# Patient Record
Sex: Female | Born: 1945 | Race: Black or African American | Hispanic: No | Marital: Single | Smoking: Light tobacco smoker
Health system: Southern US, Community
[De-identification: ages and names within clinical notes are randomized; demographics above are authoritative.]

## PROBLEM LIST (undated history)

## (undated) DIAGNOSIS — I2510 Atherosclerotic heart disease of native coronary artery without angina pectoris: Secondary | ICD-10-CM

## (undated) DIAGNOSIS — E785 Hyperlipidemia, unspecified: Secondary | ICD-10-CM

## (undated) DIAGNOSIS — N200 Calculus of kidney: Secondary | ICD-10-CM

## (undated) DIAGNOSIS — I10 Essential (primary) hypertension: Secondary | ICD-10-CM

## (undated) HISTORY — DX: Atherosclerotic heart disease of native coronary artery without angina pectoris: I25.10

## (undated) HISTORY — DX: Calculus of kidney: N20.0

## (undated) HISTORY — PX: MULTIPLE TOOTH EXTRACTIONS: SHX2053

## (undated) HISTORY — PX: CARDIAC CATHETERIZATION: SHX172

---

## 2007-09-17 ENCOUNTER — Emergency Department: Payer: Self-pay | Admitting: Emergency Medicine

## 2007-12-04 ENCOUNTER — Emergency Department: Payer: Self-pay | Admitting: Emergency Medicine

## 2007-12-10 ENCOUNTER — Emergency Department: Payer: Self-pay | Admitting: Emergency Medicine

## 2008-04-20 ENCOUNTER — Ambulatory Visit: Payer: Self-pay | Admitting: Urology

## 2008-06-03 ENCOUNTER — Emergency Department: Payer: Self-pay | Admitting: Emergency Medicine

## 2009-07-17 IMAGING — CR DG ABDOMEN 1V
1 series · 1 of 1 positions shown · non-contrast
Comparison: none

REASON FOR EXAM: nephrolithiasis
COMMENTS:

[view not recorded]
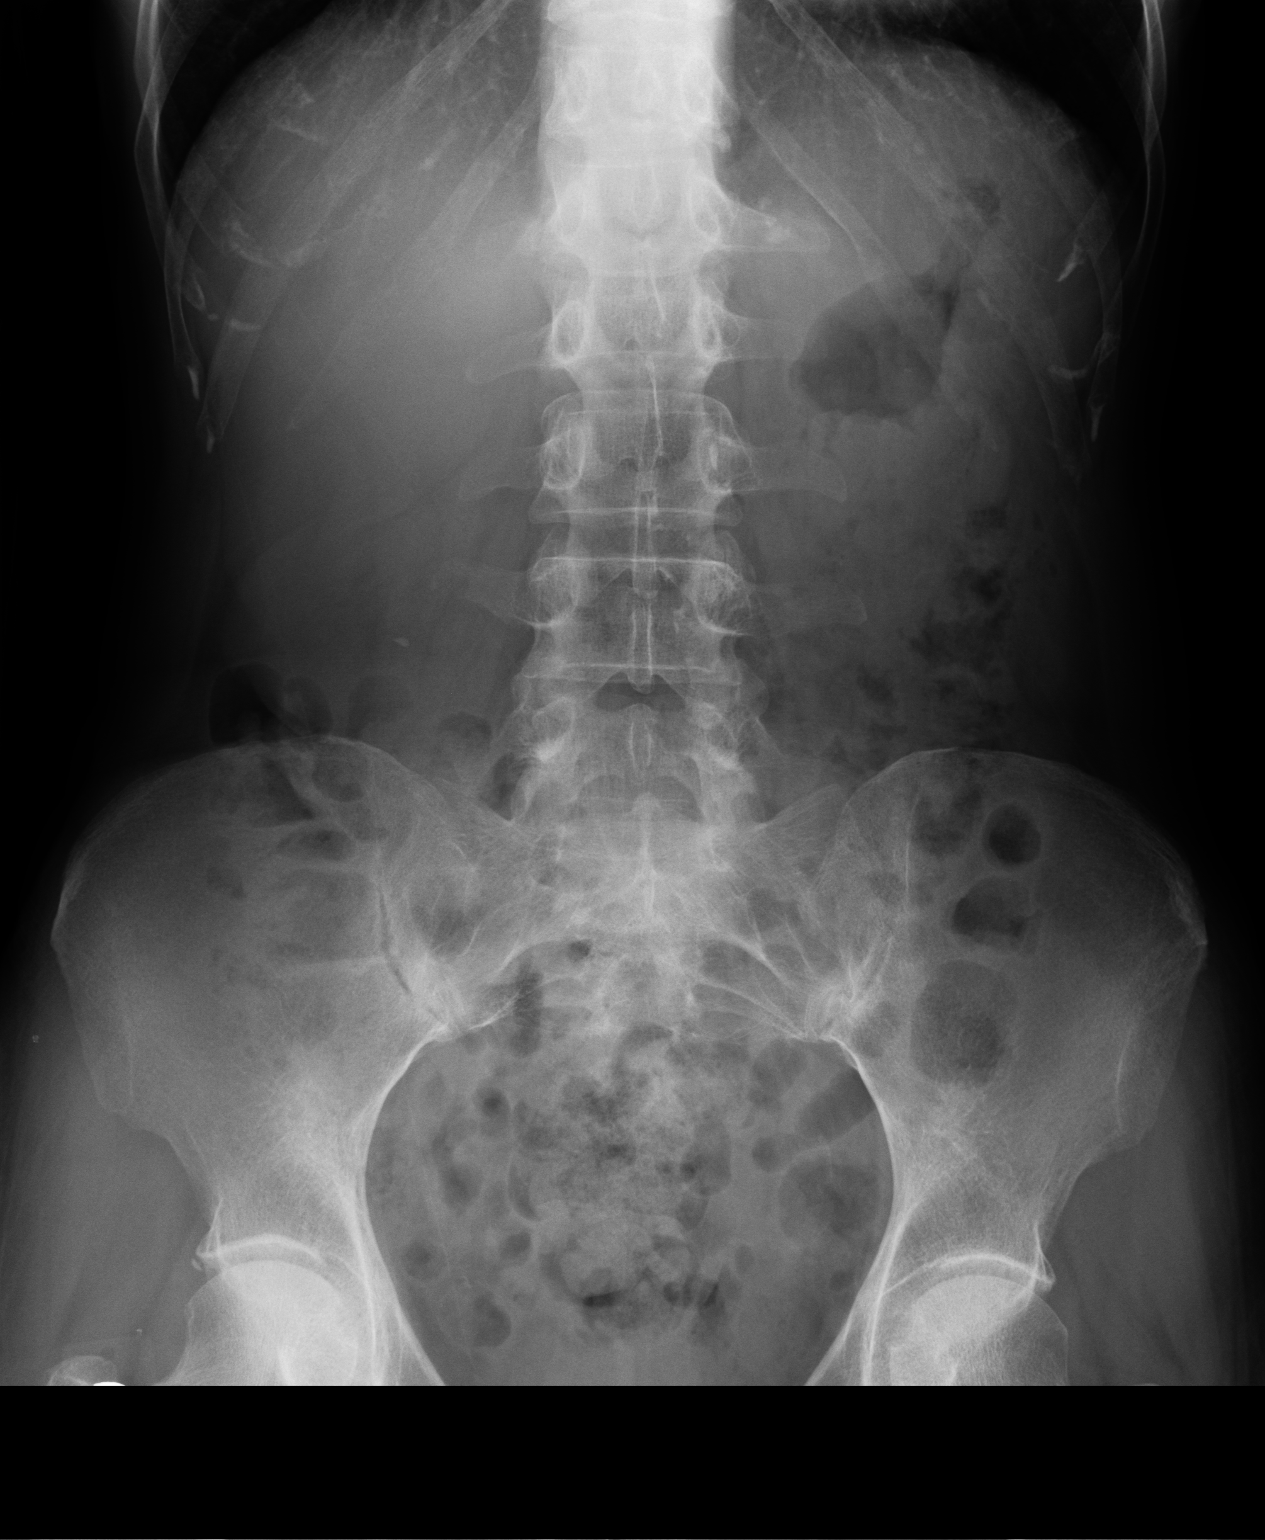

[1 of 1 positions shown; findings below may reference images not displayed]

PROCEDURE:     DXR - DXR KIDNEY URETER BLADDER  - April 20, 2008  [DATE]

RESULT:     There is a 4 mm calcification projected at the lower pole of the
RIGHT kidney. This is suspicious for a RIGHT renal stone as was previously
noted at CT. No LEFT renal stones are seen. No ureteral calcifications are
identified.
IMPRESSION: Probable RIGHT nephrolithiasis.

## 2010-04-19 ENCOUNTER — Inpatient Hospital Stay: Payer: Self-pay | Admitting: Internal Medicine

## 2015-05-14 ENCOUNTER — Encounter: Payer: Self-pay | Admitting: Intensive Care

## 2015-05-14 ENCOUNTER — Emergency Department
Admission: EM | Admit: 2015-05-14 | Discharge: 2015-05-15 | Disposition: A | Discharge status: Home / Self Care | Payer: Medicare Other | Attending: Emergency Medicine | Admitting: Emergency Medicine

## 2015-05-14 ENCOUNTER — Emergency Department: Payer: Medicare Other

## 2015-05-14 DIAGNOSIS — Z79899 Other long term (current) drug therapy: Secondary | ICD-10-CM | POA: Diagnosis not present

## 2015-05-14 DIAGNOSIS — Z7982 Long term (current) use of aspirin: Secondary | ICD-10-CM | POA: Insufficient documentation

## 2015-05-14 DIAGNOSIS — J441 Chronic obstructive pulmonary disease with (acute) exacerbation: Secondary | ICD-10-CM | POA: Diagnosis not present

## 2015-05-14 DIAGNOSIS — R079 Chest pain, unspecified: Secondary | ICD-10-CM | POA: Diagnosis present

## 2015-05-14 DIAGNOSIS — Z720 Tobacco use: Secondary | ICD-10-CM | POA: Diagnosis not present

## 2015-05-14 DIAGNOSIS — I10 Essential (primary) hypertension: Secondary | ICD-10-CM | POA: Insufficient documentation

## 2015-05-14 DIAGNOSIS — J449 Chronic obstructive pulmonary disease, unspecified: Secondary | ICD-10-CM

## 2015-05-14 DIAGNOSIS — Z880 Allergy status to penicillin: Secondary | ICD-10-CM | POA: Insufficient documentation

## 2015-05-14 HISTORY — DX: Hyperlipidemia, unspecified: E78.5

## 2015-05-14 HISTORY — DX: Essential (primary) hypertension: I10

## 2015-05-14 LAB — CBC
HCT: 41.1 % (ref 35.0–47.0)
Hemoglobin: 13.2 g/dL (ref 12.0–16.0)
MCH: 24.1 pg — ABNORMAL LOW (ref 26.0–34.0)
MCHC: 32.1 g/dL (ref 32.0–36.0)
MCV: 75.1 fL — ABNORMAL LOW (ref 80.0–100.0)
PLATELETS: 327 10*3/uL (ref 150–440)
RBC: 5.48 MIL/uL — AB (ref 3.80–5.20)
RDW: 14.4 % (ref 11.5–14.5)
WBC: 9.8 10*3/uL (ref 3.6–11.0)

## 2015-05-14 LAB — BASIC METABOLIC PANEL
Anion gap: 6 (ref 5–15)
BUN: 7 mg/dL (ref 6–20)
CALCIUM: 9.5 mg/dL (ref 8.9–10.3)
CHLORIDE: 108 mmol/L (ref 101–111)
CO2: 27 mmol/L (ref 22–32)
CREATININE: 0.78 mg/dL (ref 0.44–1.00)
GFR calc non Af Amer: >60 mL/min (ref >60)
Glucose, Bld: 95 mg/dL (ref 65–99)
Potassium: 3.9 mmol/L (ref 3.5–5.1)
SODIUM: 141 mmol/L (ref 135–145)

## 2015-05-14 LAB — TROPONIN I

## 2015-05-14 NOTE — ED Provider Notes (Addendum)
[facility] Emergency Department Provider Note  ____________________________________________  Time seen: Approximately 11:10 PM  I have reviewed the triage vital signs and the nursing notes.   HISTORY  Chief Complaint Chest Pain    HPI [place] M Boshers is a 69 y.o. female who presents to the ED from home via EMS with a chief complaint of chest pain. Patient describes sharp, left-sided chest pain which began approximately 3:30 AM while she was still awake. Describes nonradiating sharp pain lasting seconds, then going away, waxing/waning throughout the day. Symptoms are not associated with diaphoresis, shortness of breath, nausea, vomiting or palpitations. Patient just started an increase in lisinopril from 20 mg to 40 mg yesterday.States has some allergy issues with dry cough. Denies fever, chills, abdominal pain, diarrhea, dysuria, headache, calf pain or swelling. Denies recent travel, injury or hormone use.   Past Medical History  Diagnosis Date  . Hypertension   . Hyperlipidemia   . MI (myocardial infarction)     There are no active problems to display for this patient.   Past Surgical History  Procedure Laterality Date  . Coronary artery bypass graft      Current Outpatient Rx  Name  Route  Sig  Dispense  Refill  . aspirin 81 MG chewable tablet   Oral   Chew 81 mg by mouth daily.         Marland Kitchen lisinopril (PRINIVIL,ZESTRIL) 40 MG tablet   Oral   Take 40 mg by mouth daily.         . metoprolol tartrate (LOPRESSOR) 25 MG tablet   Oral   Take 25 mg by mouth 2 (two) times daily.         . simvastatin (ZOCOR) 20 MG tablet   Oral   Take 20 mg by mouth daily.         Marland Kitchen albuterol (PROVENTIL HFA;VENTOLIN HFA) 108 (90 BASE) MCG/ACT inhaler   Inhalation   Inhale 2 puffs into the lungs every 4 (four) hours as needed for wheezing or shortness of breath.   1 Inhaler   0     Allergies Penicillins  History reviewed. No pertinent family  history.  Social History Social History  Substance Use Topics  . Smoking status: Current Every Day Smoker    Types: Cigarettes  . Smokeless tobacco: Never Used  . Alcohol Use: No    Review of Systems Constitutional: No fever/chills Eyes: No visual changes. ENT: No sore throat. Cardiovascular: Positive for chest pain. Respiratory: Denies shortness of breath. Gastrointestinal: No abdominal pain.  No nausea, no vomiting.  No diarrhea.  No constipation. Genitourinary: Negative for dysuria. Musculoskeletal: Negative for back pain. Skin: Negative for rash. Neurological: Negative for headaches, focal weakness or numbness.  10-point ROS otherwise negative.  ____________________________________________   PHYSICAL EXAM:  VITAL SIGNS: ED Triage Vitals  Enc Vitals Group     BP 05/14/15 2056 203/104 mmHg     Pulse Rate 05/14/15 2057 64     Resp 05/14/15 2103 16     Temp 05/14/15 2103 98.4 F (36.9 C)     Temp Source 05/14/15 2103 Oral     SpO2 05/14/15 2057 100 %     Weight 05/14/15 2103 134 lb (60.782 kg)     Height 05/14/15 2103 5\' 5"  (1.651 m)     Head Cir --      Peak Flow --      Pain Score 05/14/15 2104 6     Pain Loc --  Pain Edu? --      Excl. in GC? --     Constitutional: Alert and oriented. Well appearing and in no acute distress. Eyes: Conjunctivae are normal. PERRL. EOMI. Head: Atraumatic. Nose: No congestion/rhinnorhea. Mouth/Throat: Mucous membranes are moist.  Oropharynx non-erythematous. Neck: No stridor.   Cardiovascular: Normal rate, regular rhythm. Grossly normal heart sounds.  Good peripheral circulation. Respiratory: Normal respiratory effort.  No retractions. Lungs CTAB. Gastrointestinal: Soft and nontender. No distention. No abdominal bruits. No CVA tenderness. Musculoskeletal: No lower extremity tenderness nor edema.  No joint effusions. Neurologic:  Normal speech and language. No gross focal neurologic deficits are appreciated. No gait  instability. Skin:  Skin is warm, dry and intact. No rash noted. Psychiatric: Mood and affect are normal. Speech and behavior are normal.  ____________________________________________   LABS (all labs ordered are listed, but only abnormal results are displayed)  Labs Reviewed  CBC - Abnormal; Notable for the following:    RBC 5.48 (*)    MCV 75.1 (*)    MCH 24.1 (*)    All other components within normal limits  FIBRIN DERIVATIVES D-DIMER ([facility] ONLY) - Abnormal; Notable for the following:    Fibrin derivatives D-dimer [facility]) 8379 (*)    All other components within normal limits  BASIC METABOLIC PANEL  TROPONIN I  TROPONIN I   ____________________________________________  EKG  ED ECG REPORT I, Aireonna Bauer J, the attending physician, personally viewed and interpreted this ECG.   Date: 05/14/2015  EKG Time: 2101  Rate: 58  Rhythm: sinus bradycardia  Axis: Normal  Intervals:none  ST&T Change: Nonspecific  ____________________________________________  RADIOLOGY  Chest 2 view (viewed by me, interpreted per Dr. Chilton Si): No active cardiopulmonary disease.  CT chest interpreted per Dr. Andria Meuse: No evidence of significant pulmonary embolus. Emphysematous changes in the lungs. No evidence of active consolidation. ____________________________________________   PROCEDURES  Procedure(s) performed: None  Critical Care performed: No  ____________________________________________   INITIAL IMPRESSION / ASSESSMENT AND PLAN / ED COURSE  Pertinent labs & imaging results that were available during my care of the patient were reviewed by me and considered in my medical decision making (see chart for details).  69 year old female who presents with atypical chest pain onset at 3:30am yesterday morning. Initial EKG and troponin negative. Plan to repeat 3 hour troponin as well as check a d-dimer. Currently patient resting in no acute distress without complaints of  pain.  ----------------------------------------- 12:19 AM on 05/15/2015 -----------------------------------------  Elevated d-dimer noted. Will obtain CT angiography chest to evaluate for pulmonary embolus.  ----------------------------------------- 3:00 AM on 05/15/2015 -----------------------------------------  Delay secondary to critical patient. Updated patient and family of negative repeat troponin and CT results. Will prescribe albuterol inhaler for patient with occasional wheezing secondary to smoking. Will follow up with cardiologist next week. Strict return precautions given. Patient and family verbalizes understanding and agree with plan of care. ____________________________________________   FINAL CLINICAL IMPRESSION(S) / ED DIAGNOSES  Final diagnoses:  Chest pain, unspecified chest pain type  Chronic obstructive pulmonary disease, unspecified COPD, unspecified chronic bronchitis type      Irean Hong, MD 05/15/15 8295  Irean Hong, MD 05/15/15 6213  Irean Hong, MD 05/15/15 0865

## 2015-05-14 NOTE — ED Notes (Signed)
Patient c/o chest pain that started last night. She reports " I will feel a shooting pain and it will go away for 30 minutes and then I will feel the sharp shooting pain again". Reports it is L sided chest pain with movement. HX MI in 2009-stents placed. Blood pressure with EMS 221/97

## 2015-05-15 ENCOUNTER — Emergency Department: Payer: Medicare Other

## 2015-05-15 DIAGNOSIS — R079 Chest pain, unspecified: Secondary | ICD-10-CM | POA: Diagnosis not present

## 2015-05-15 LAB — FIBRIN DERIVATIVES D-DIMER (ARMC ONLY): Fibrin derivatives D-dimer (ARMC): 8379 — ABNORMAL HIGH (ref 0–499)

## 2015-05-15 LAB — TROPONIN I: Troponin I: <0.03 ng/mL (ref <0.031)

## 2015-05-15 MED ORDER — IOHEXOL 350 MG/ML SOLN
75.0000 mL | Freq: Once | INTRAVENOUS | Status: AC | PRN
  Administered 2015-05-15: 75 mL via INTRAVENOUS

## 2015-05-15 MED ORDER — ALBUTEROL SULFATE HFA 108 (90 BASE) MCG/ACT IN AERS: 2.0000 | INHALATION_SPRAY | RESPIRATORY_TRACT | Status: DC | PRN

## 2015-05-15 MED ORDER — SODIUM CHLORIDE 0.9 % IV BOLUS (SEPSIS)
500.0000 mL | Freq: Once | INTRAVENOUS | Status: AC
  Administered 2015-05-15: 500 mL via INTRAVENOUS

## 2015-05-15 NOTE — Discharge Instructions (Signed)
1. Use albuterol inhaler 2 puffs every 4 hours as needed for wheezing. 2. Return to the ER for worsening symptoms, persistent vomiting, difficulty breathing or other concerns.  Chest Pain (Nonspecific) It is often hard to give a specific diagnosis for the cause of chest pain. There is always a chance that your pain could be related to something serious, such as a heart attack or a blood clot in the lungs. You need to follow up with your health care provider for further evaluation. CAUSES   Heartburn.  Pneumonia or bronchitis.  Anxiety or stress.  Inflammation around your heart (pericarditis) or lung (pleuritis or pleurisy).  A blood clot in the lung.  A collapsed lung (pneumothorax). It can develop suddenly on its own (spontaneous pneumothorax) or from trauma to the chest.  Shingles infection (herpes zoster virus). The chest wall is composed of bones, muscles, and cartilage. Any of these can be the source of the pain.  The bones can be bruised by injury.  The muscles or cartilage can be strained by coughing or overwork.  The cartilage can be affected by inflammation and become sore (costochondritis). DIAGNOSIS  Lab tests or other studies may be needed to find the cause of your pain. Your health care provider may have you take a test called an ambulatory electrocardiogram (ECG). An ECG records your heartbeat patterns over a 24-hour period. You may also have other tests, such as:  Transthoracic echocardiogram (TTE). During echocardiography, sound waves are used to evaluate how blood flows through your heart.  Transesophageal echocardiogram (TEE).  Cardiac monitoring. This allows your health care provider to monitor your heart rate and rhythm in real time.  Holter monitor. This is a portable device that records your heartbeat and can help diagnose heart arrhythmias. It allows your health care provider to track your heart activity for several days, if needed.  Stress tests by  exercise or by giving medicine that makes the heart beat faster. TREATMENT   Treatment depends on what may be causing your chest pain. Treatment may include:  Acid blockers for heartburn.  Anti-inflammatory medicine.  Pain medicine for inflammatory conditions.  Antibiotics if an infection is present.  You may be advised to change lifestyle habits. This includes stopping smoking and avoiding alcohol, caffeine, and chocolate.  You may be advised to keep your head raised (elevated) when sleeping. This reduces the chance of acid going backward from your stomach into your esophagus. Most of the time, nonspecific chest pain will improve within 2-3 days with rest and mild pain medicine.  HOME CARE INSTRUCTIONS   If antibiotics were prescribed, take them as directed. Finish them even if you start to feel better.  For the next few days, avoid physical activities that bring on chest pain. Continue physical activities as directed.  Do not use any tobacco products, including cigarettes, chewing tobacco, or electronic cigarettes.  Avoid drinking alcohol.  Only take medicine as directed by your health care provider.  Follow your health care provider's suggestions for further testing if your chest pain does not go away.  Keep any follow-up appointments you made. If you do not go to an appointment, you could develop lasting (chronic) problems with pain. If there is any problem keeping an appointment, call to reschedule. SEEK MEDICAL CARE IF:  1. Your chest pain does not go away, even after treatment. 2. You have a rash with blisters on your chest. 3. You have a fever. SEEK IMMEDIATE MEDICAL CARE IF:   You have increased  chest pain or pain that spreads to your arm, neck, jaw, back, or abdomen.  You have shortness of breath.  You have an increasing cough, or you cough up blood.  You have severe back or abdominal pain.  You feel nauseous or vomit.  You have severe weakness.  You  faint.  You have chills. This is an emergency. Do not wait to see if the pain will go away. Get medical help at once. Call your local emergency services (911 in [place] Do not drive yourself to the hospital. MAKE SURE YOU:   Understand these instructions.  Will watch your condition.  Will get help right away if you are not doing well or get worse. Document Released: 05/31/2005 Document Revised: 08/26/2013 Document Reviewed: 03/26/2008 [facility] Patient Information 2015 [place], [place]. This information is not intended to replace advice given to you by your health care provider. Make sure you discuss any questions you have with your health care provider.  Chronic Obstructive Pulmonary Disease Chronic obstructive pulmonary disease (COPD) is a common lung condition in which airflow from the lungs is limited. COPD is a general term that can be used to describe many different lung problems that limit airflow, including both chronic bronchitis and emphysema. If you have COPD, your lung function will probably never return to normal, but there are measures you can take to improve lung function and make yourself feel better.  CAUSES   Smoking (common).   Exposure to secondhand smoke.   Genetic problems.  Chronic inflammatory lung diseases or recurrent infections. SYMPTOMS   Shortness of breath, especially with physical activity.   Deep, persistent (chronic) cough with a large amount of thick mucus.   Wheezing.   Rapid breaths (tachypnea).   Gray or bluish discoloration (cyanosis) of the skin, especially in fingers, toes, or lips.   Fatigue.   Weight loss.   Frequent infections or episodes when breathing symptoms become much worse (exacerbations).   Chest tightness. DIAGNOSIS  Your health care provider will take a medical history and perform a physical examination to make the initial diagnosis. Additional tests for COPD may include:   Lung (pulmonary) function  tests.  Chest X-ray.  CT scan.  Blood tests. TREATMENT  Treatment available to help you feel better when you have COPD includes:   Inhaler and nebulizer medicines. These help manage the symptoms of COPD and make your breathing more comfortable.  Supplemental oxygen. Supplemental oxygen is only helpful if you have a low oxygen level in your blood.   Exercise and physical activity. These are beneficial for nearly all people with COPD. Some people may also benefit from a pulmonary rehabilitation program. HOME CARE INSTRUCTIONS   Take all medicines (inhaled or pills) as directed by your health care provider.  Avoid over-the-counter medicines or cough syrups that dry up your airway (such as antihistamines) and slow down the elimination of secretions unless instructed otherwise by your health care provider.   If you are a smoker, the most important thing that you can do is stop smoking. Continuing to smoke will cause further lung damage and breathing trouble. Ask your health care provider for help with quitting smoking. He or she can direct you to community resources or hospitals that provide support.  Avoid exposure to irritants such as smoke, chemicals, and fumes that aggravate your breathing.  Use oxygen therapy and pulmonary rehabilitation if directed by your health care provider. If you require home oxygen therapy, ask your health care provider whether you should purchase  a pulse oximeter to measure your oxygen level at home.   Avoid contact with individuals who have a contagious illness.  Avoid extreme temperature and humidity changes.  Eat healthy foods. Eating smaller, more frequent meals and resting before meals may help you maintain your strength.  Stay active, but balance activity with periods of rest. Exercise and physical activity will help you maintain your ability to do things you want to do.  Preventing infection and hospitalization is very important when you have  COPD. Make sure to receive all the vaccines your health care provider recommends, especially the pneumococcal and influenza vaccines. Ask your health care provider whether you need a pneumonia vaccine.  Learn and use relaxation techniques to manage stress.  Learn and use controlled breathing techniques as directed by your health care provider. Controlled breathing techniques include:   Pursed lip breathing. Start by breathing in (inhaling) through your nose for 1 second. Then, purse your lips as if you were going to whistle and breathe out (exhale) through the pursed lips for 2 seconds.   Diaphragmatic breathing. Start by putting one hand on your abdomen just above your waist. Inhale slowly through your nose. The hand on your abdomen should move out. Then purse your lips and exhale slowly. You should be able to feel the hand on your abdomen moving in as you exhale.   Learn and use controlled coughing to clear mucus from your lungs. Controlled coughing is a series of short, progressive coughs. The steps of controlled coughing are:  4. Lean your head slightly forward.  5. Breathe in deeply using diaphragmatic breathing.  6. Try to hold your breath for 3 seconds.  7. Keep your mouth slightly open while coughing twice.  8. Spit any mucus out into a tissue.  9. Rest and repeat the steps once or twice as needed. SEEK MEDICAL CARE IF:   You are coughing up more mucus than usual.   There is a change in the color or thickness of your mucus.   Your breathing is more labored than usual.   Your breathing is faster than usual.  SEEK IMMEDIATE MEDICAL CARE IF:   You have shortness of breath while you are resting.   You have shortness of breath that prevents you from:  Being able to talk.   Performing your usual physical activities.   You have chest pain lasting longer than 5 minutes.   Your skin color is more cyanotic than usual.  You measure low oxygen saturations for  longer than 5 minutes with a pulse oximeter. MAKE SURE YOU:   Understand these instructions.  Will watch your condition.  Will get help right away if you are not doing well or get worse. Document Released: 05/31/2005 Document Revised: 01/05/2014 Document Reviewed: 04/17/2013 [facility] Patient Information 2015 [place], [place]. This information is not intended to replace advice given to you by your health care provider. Make sure you discuss any questions you have with your health care provider.

## 2015-05-15 NOTE — ED Notes (Signed)
Patient transported to CT 

## 2015-06-28 ENCOUNTER — Ambulatory Visit (INDEPENDENT_AMBULATORY_CARE_PROVIDER_SITE_OTHER): Payer: Medicare Other | Admitting: Cardiovascular Disease

## 2015-06-28 ENCOUNTER — Encounter: Payer: Self-pay | Admitting: Cardiovascular Disease

## 2015-06-28 VITALS — BP 170/88 | HR 54 | Ht 65.0 in | Wt 132.8 lb

## 2015-06-28 DIAGNOSIS — Z720 Tobacco use: Secondary | ICD-10-CM | POA: Diagnosis not present

## 2015-06-28 DIAGNOSIS — I10 Essential (primary) hypertension: Secondary | ICD-10-CM

## 2015-06-28 DIAGNOSIS — R0789 Other chest pain: Secondary | ICD-10-CM

## 2015-06-28 MED ORDER — CHLORTHALIDONE 25 MG PO TABS: 25.0000 mg | ORAL_TABLET | Freq: Every day | ORAL | Status: DC

## 2015-06-28 NOTE — Patient Instructions (Signed)
Medication Instructions:  Your physician has recommended you make the following change in your medication:  START taking chlorthalidone  once per day    Labwork: BMET in one week  Testing/Procedures: Your physician has requested that you have a renal artery duplex in one week. During this test, an ultrasound is used to evaluate blood flow to the kidneys. Allow one hour for this exam. Do not eat after midnight the day before and avoid carbonated beverages. Take your medications as you usually do.  Your physician has requested that you have an exercise tolerance test in 2 weeks. For further information please visit [email or web address] Please also follow instruction sheet, as given. DO NOT TAKE METOPROLOL THE MORNING OF YOUR TEST.     Follow-Up: Your physician recommends that you schedule a follow-up appointment in: one month with Dr. Kirke Corin.    Any Other Special Instructions Will Be Listed Below (If Applicable).  Exercise Stress Electrocardiogram An exercise stress electrocardiogram is a test that is done to evaluate the blood supply to your heart. This test may also be called exercise stress electrocardiography. The test is done while you are walking on a treadmill. The goal of this test is to raise your heart rate. This test is done to find areas of poor blood flow to the heart by determining the extent of coronary artery disease (CAD).   CAD is defined as narrowing in one or more heart (coronary) arteries of more than 70%. If you have an abnormal test result, this may mean that you are not getting adequate blood flow to your heart during exercise. Additional testing may be needed to understand why your test was abnormal. LET [facility] CARE PROVIDER KNOW ABOUT:   Any allergies you have.  All medicines you are taking, including vitamins, herbs, eye drops, creams, and over-the-counter medicines.  Previous problems you or members of your family have had with the use of  anesthetics.  Any blood disorders you have.  Previous surgeries you have had.  Medical conditions you have.  Possibility of pregnancy, if this applies. RISKS AND COMPLICATIONS Generally, this is a safe procedure. However, as with any procedure, complications can occur. Possible complications can include:  Pain or pressure in the following areas:  Chest.  Jaw or neck.  Between your shoulder blades.  Radiating down your left arm.  Dizziness or light-headedness.  Shortness of breath.  Increased or irregular heartbeats.  Nausea or vomiting.  Heart attack (rare). BEFORE THE PROCEDURE  Avoid all forms of caffeine 24 hours before your test or as directed by your health care provider. This includes coffee, tea (even decaffeinated tea), caffeinated sodas, chocolate, cocoa, and certain pain medicines.  Follow your health care provider's instructions regarding eating and drinking before the test.  Take your medicines as directed at regular times with water unless instructed otherwise. Exceptions may include:  If you have diabetes, ask how you are to take your insulin or pills. It is common to adjust insulin dosing the morning of the test.  If you are taking beta-blocker medicines, it is important to talk to your health care provider about these medicines well before the date of your test. Taking beta-blocker medicines may interfere with the test. In some cases, these medicines need to be changed or stopped 24 hours or more before the test.  If you wear a nitroglycerin patch, it may need to be removed prior to the test. Ask your health care provider if the patch should be removed before the test.  If you use an inhaler for any breathing condition, bring it with you to the test.  If you are an outpatient, bring a snack so you can eat right after the stress phase of the test.  Do not smoke for 4 hours prior to the test or as directed by your health care provider.  Do not apply  lotions, powders, creams, or oils on your chest prior to the test.  Wear loose-fitting clothes and comfortable shoes for the test. This test involves walking on a treadmill. PROCEDURE  Multiple patches (electrodes) will be put on your chest. If needed, small areas of your chest may have to be shaved to get better contact with the electrodes. Once the electrodes are attached to your body, multiple wires will be attached to the electrodes and your heart rate will be monitored.  Your heart will be monitored both at rest and while exercising.  You will walk on a treadmill. The treadmill will be started at a slow pace. The treadmill speed and incline will gradually be increased to raise your heart rate. AFTER THE PROCEDURE  Your heart rate and blood pressure will be monitored after the test.  You may return to your normal schedule including diet, activities, and medicines, unless your health care provider tells you otherwise.   This information is not intended to replace advice given to you by your health care provider. Make sure you discuss any questions you have with your health care provider.   Document Released: 08/18/2000 Document Revised: 08/26/2013 Document Reviewed: 04/28/2013 [employer] Interactive Patient Education [employer]2016 [employer]

## 2015-06-28 NOTE — Progress Notes (Signed)
Primary care physician: Dr. Letta PateAycock at [facility]  HPI  This is a 69 year old female who was referred for evaluation of chest pain and hypertension. She reports having cardiac catheterization done in 2009 without revascularization. The report is not available. She has known history of hypertension for many years which has been difficult to control. The blood pressure became more difficult to control over the last 6 months in spite of treatment with metoprolol and this in. She also has known history of hyperlipidemia on treatment and prolonged history of tobacco use and possible COPD. She smokes one pack per day and has been doing so since she was 69 years old. She had one episode of substernal chest pain described as an aching sensation in September which was induced by stress. [facility]. Troponin was negative 2. EKG showed no acute changes. D-dimer was significantly elevated but CTA of the chest showed no evidence of pulmonary embolism. She reports no further episodes of chest pain since then but blood pressure continues to be uncontrolled. She has no family history of coronary artery disease but  there is family history of hypertension. Allergies  Allergen Reactions  . Penicillins Rash     Current Outpatient Prescriptions on File Prior to Visit  Medication Sig Dispense Refill  . albuterol (PROVENTIL HFA;VENTOLIN HFA) 108 (90 BASE) MCG/ACT inhaler Inhale 2 puffs into the lungs every 4 (four) hours as needed for wheezing or shortness of breath. 1 Inhaler 0  . aspirin 81 MG chewable tablet Chew 81 mg by mouth daily.    Marland Kitchen. lisinopril (PRINIVIL,ZESTRIL) 40 MG tablet Take 40 mg by mouth daily.    . metoprolol tartrate (LOPRESSOR) 25 MG tablet Take 25 mg by mouth 2 (two) times daily.    . simvastatin (ZOCOR) 20 MG tablet Take 20 mg by mouth daily.     No current facility-administered medications on file prior to visit.     Past Medical History   Diagnosis Date  . Hypertension   . Hyperlipidemia   . MI (myocardial infarction) (HCC)   . Coronary artery disease   . Kidney stones      Past Surgical History  Procedure Laterality Date  . [facility]     Family History  Problem Relation Age of Onset  . Family history : HTN     Social History   Social History  . Marital Status: Single    Spouse Name: N/A  . Number of Children: N/A  . Years of Education: N/A   Occupational History  . Not on file.   Social History Main Topics  . Smoking status: Current Every Day Smoker -- 50 years    Types: Cigarettes  . Smokeless tobacco: Never Used  . Alcohol Use: No  . Drug Use: No  . Sexual Activity: Not on file   Other Topics Concern  . Not on file   Social History Narrative     ROS A 10 point review of system was performed. It is negative other than that mentioned in the history of present illness.   PHYSICAL EXAM   BP 170/88 mmHg  Pulse 54  Ht 5\' 5"  (1.651 m)  Wt 132 lb 12 oz (60.215 kg)  BMI 22.09 kg/m2 Constitutional: She is oriented to person, place, and time. She appears well-developed and well-nourished. No distress.  HENT: No nasal discharge.  Head: Normocephalic and atraumatic.  Eyes: Pupils are equal  and round. No discharge.  Neck: Normal range of motion. Neck supple. No JVD present. No thyromegaly present.  Cardiovascular: Normal rate, regular rhythm, normal heart sounds. Exam reveals no gallop and no friction rub. No murmur heard.  Pulmonary/Chest: Effort normal and breath sounds normal. No stridor. No respiratory distress. She has no wheezes. She has no rales. She exhibits no tenderness.  Abdominal: Soft. Bowel sounds are normal. She exhibits no distension. There is no tenderness. There is no rebound and no guarding.  Musculoskeletal: Normal range of motion. She exhibits no edema and no tenderness.  Neurological: She is alert and oriented to person, place, and time.  Coordination normal.  Skin: Skin is warm and dry. No rash noted. She is not diaphoretic. No erythema. No pallor.  Psychiatric: She has a normal mood and affect. Her behavior is normal. Judgment and thought content normal.     EKG: sinus bradycardia with no significant ST or T wave changes.   ASSESSMENT AND PLAN

## 2015-06-28 NOTE — Assessment & Plan Note (Signed)
The patient's blood pressure continues to be significantly elevated which might be contributing to intermittent chest pain. She is already on lisinopril and metoprolol. I think it is important to exclude secondary hypertension especially renal artery stenosis given her prolonged history of tobacco use. Thus, I requested a renal artery duplex. I added chlorthalidone 25 mg once daily. Check basic metabolic profile in one week.

## 2015-06-28 NOTE — Assessment & Plan Note (Signed)
The chest pain is overall atypical. Cardiac exam is unremarkable and baseline ECG is normal. Considering her risk factors for coronary artery disease, I requested a treadmill stress test for evaluation.

## 2015-06-28 NOTE — Assessment & Plan Note (Signed)
I discussed with her the importance of smoking cessation. 

## 2015-07-06 ENCOUNTER — Encounter (INDEPENDENT_AMBULATORY_CARE_PROVIDER_SITE_OTHER): Payer: Self-pay

## 2015-07-06 ENCOUNTER — Ambulatory Visit (INDEPENDENT_AMBULATORY_CARE_PROVIDER_SITE_OTHER): Payer: Medicare Other

## 2015-07-06 DIAGNOSIS — I10 Essential (primary) hypertension: Secondary | ICD-10-CM | POA: Diagnosis not present

## 2015-07-07 LAB — EXERCISE TOLERANCE TEST
CSEPED: 2 min
CSEPEDS: 34 s
CSEPEW: 4.6 METS
CSEPPHR: 164 {beats}/min
MPHR: 153 {beats}/min
Percent HR: 107 %
Rest HR: 89 {beats}/min

## 2015-07-12 ENCOUNTER — Other Ambulatory Visit (INDEPENDENT_AMBULATORY_CARE_PROVIDER_SITE_OTHER): Payer: Medicare Other | Admitting: *Deleted

## 2015-07-12 DIAGNOSIS — I10 Essential (primary) hypertension: Secondary | ICD-10-CM | POA: Diagnosis not present

## 2015-07-13 LAB — BASIC METABOLIC PANEL
BUN / CREAT RATIO: 21 (ref 11–26)
BUN: 20 mg/dL (ref 8–27)
CHLORIDE: 99 mmol/L (ref 97–106)
CO2: 23 mmol/L (ref 18–29)
Calcium: 10.2 mg/dL (ref 8.7–10.3)
Creatinine, Ser: 0.94 mg/dL (ref 0.57–1.00)
GFR calc non Af Amer: 63 mL/min/{1.73_m2} (ref >59)
GFR, EST AFRICAN AMERICAN: 73 mL/min/{1.73_m2} (ref >59)
Glucose: 83 mg/dL (ref 65–99)
POTASSIUM: 4.8 mmol/L (ref 3.5–5.2)
Sodium: 140 mmol/L (ref 136–144)

## 2015-07-26 ENCOUNTER — Other Ambulatory Visit: Payer: Medicare Other

## 2015-07-26 ENCOUNTER — Ambulatory Visit (INDEPENDENT_AMBULATORY_CARE_PROVIDER_SITE_OTHER): Payer: Medicare Other

## 2015-07-26 DIAGNOSIS — I10 Essential (primary) hypertension: Secondary | ICD-10-CM

## 2015-07-27 ENCOUNTER — Telehealth: Payer: Self-pay | Admitting: Cardiovascular Disease

## 2015-07-27 NOTE — Telephone Encounter (Signed)
[facility]  Please call back.

## 2015-07-27 NOTE — Telephone Encounter (Signed)
Pt called back regarding renal US results. (see result note) Pt verbalized understanding and confirmed 11/29 appt w/Ryan Dunn Pt had no further questions.

## 2015-08-03 ENCOUNTER — Ambulatory Visit: Payer: Medicare Other | Admitting: Physician Assistant

## 2015-08-27 ENCOUNTER — Ambulatory Visit: Payer: Medicare Other | Admitting: Cardiovascular Disease

## 2015-09-13 ENCOUNTER — Other Ambulatory Visit: Payer: Self-pay

## 2015-09-13 ENCOUNTER — Ambulatory Visit (INDEPENDENT_AMBULATORY_CARE_PROVIDER_SITE_OTHER): Payer: Medicare Other | Admitting: Cardiovascular Disease

## 2015-09-13 ENCOUNTER — Encounter: Payer: Self-pay | Admitting: Cardiovascular Disease

## 2015-09-13 VITALS — BP 128/70 | HR 70 | Ht 65.0 in | Wt 127.2 lb

## 2015-09-13 DIAGNOSIS — I10 Essential (primary) hypertension: Secondary | ICD-10-CM

## 2015-09-13 DIAGNOSIS — R0789 Other chest pain: Secondary | ICD-10-CM | POA: Diagnosis not present

## 2015-09-13 DIAGNOSIS — Z720 Tobacco use: Secondary | ICD-10-CM

## 2015-09-13 MED ORDER — CHLORTHALIDONE 25 MG PO TABS: 25.0000 mg | ORAL_TABLET | Freq: Every day | ORAL | Status: DC

## 2015-09-13 MED ORDER — METOPROLOL TARTRATE 25 MG PO TABS: 25.0000 mg | ORAL_TABLET | Freq: Two times a day (BID) | ORAL | Status: DC

## 2015-09-13 NOTE — Progress Notes (Signed)
Primary care physician: Dr. Letta PateAycock at [facility]  HPI  This is a 70 year old female who is here today for a follow-up visit regarding chest pain and hypertension. She reports having cardiac catheterization done in 2009 without revascularization. The report is not available.  She was seen in October for difficult to control hypertension and atypical chest pain. She has known history of hyperlipidemia on treatment and prolonged history of tobacco use and possible COPD. She smokes one pack per day and has been doing so since she was 70 years old.  During her initial evaluation, her blood pressure was 170/88. I added chlorthalidone 25 mg once daily. Renal artery duplex showed no evidence of renal artery stenosis. She underwent a treadmill stress test which showed no evidence of ischemia. She was able to exercise for only 2-1/2 minutes. She has been doing very well since her last visit with no chest pain, shortness of breath or palpitations. She feels that her blood pressure has been under excellent control since the addition of chlorthalidone. She has been cutting down on tobacco use gradually and overall feels better.  Allergies  Allergen Reactions  . Penicillins Rash     Current Outpatient Prescriptions on File Prior to Visit  Medication Sig Dispense Refill  . albuterol (PROVENTIL HFA;VENTOLIN HFA) 108 (90 BASE) MCG/ACT inhaler Inhale 2 puffs into the lungs every 4 (four) hours as needed for wheezing or shortness of breath. 1 Inhaler 0  . aspirin 81 MG chewable tablet Chew 81 mg by mouth daily.    . chlorthalidone (HYGROTON) 25 MG tablet Take 1 tablet (25 mg total) by mouth daily. 30 tablet 3  . lisinopril (PRINIVIL,ZESTRIL) 40 MG tablet Take 40 mg by mouth daily.    . metoprolol tartrate (LOPRESSOR) 25 MG tablet Take 25 mg by mouth 2 (two) times daily.    . simvastatin (ZOCOR) 20 MG tablet Take 20 mg by mouth daily.     No current facility-administered medications on file prior to  visit.     Past Medical History  Diagnosis Date  . Hypertension   . Hyperlipidemia   . MI (myocardial infarction) (HCC)   . Coronary artery disease   . Kidney stones      Past Surgical History  Procedure Laterality Date  . Cardiac catheter[facility]      [facility]     Family History  Problem Relation Age of Onset  . Family history : HTN     Social History   Social History  . Marital Status: Single    Spouse Name: N/A  . Number of Children: N/A  . Years of Education: N/A   Occupational History  . Not on file.   Social History Main Topics  . Smoking status: Current Every Day Smoker -- 50 years    Types: Cigarettes  . Smokeless tobacco: Never Used  . Alcohol Use: No  . Drug Use: No  . Sexual Activity: Not on file   Other Topics Concern  . Not on file   Social History Narrative     ROS A 10 point review of system was performed. It is negative other than that mentioned in the history of present illness.   PHYSICAL EXAM   BP 128/70 mmHg  Pulse 70  Ht 5\' 5"  (1.651 m)  Wt 127 lb 4 oz (57.72 kg)  BMI 21.18 kg/m2 Constitutional: She is oriented to person, place, and time. She appears well-developed and well-nourished. No distress.  HENT: No nasal discharge.  Head:  Normocephalic and atraumatic.  Eyes: Pupils are equal and round. No discharge.  Neck: Normal range of motion. Neck supple. No JVD present. No thyromegaly present.  Cardiovascular: Normal rate, regular rhythm, normal heart sounds. Exam reveals no gallop and no friction rub. No murmur heard.  Pulmonary/Chest: Effort normal and breath sounds normal. No stridor. No respiratory distress. She has no wheezes. She has no rales. She exhibits no tenderness.  Abdominal: Soft. Bowel sounds are normal. She exhibits no distension. There is no tenderness. There is no rebound and no guarding.  Musculoskeletal: Normal range of motion. She exhibits no edema and no tenderness.  Neurological: She is alert and oriented to  person, place, and time. Coordination normal.  Skin: Skin is warm and dry. No rash noted. She is not diaphoretic. No erythema. No pallor.  Psychiatric: She has a normal mood and affect. Her behavior is normal. Judgment and thought content normal.      ASSESSMENT AND PLAN

## 2015-09-13 NOTE — Assessment & Plan Note (Signed)
She is trying to cut down on tobacco use and I again discussed with her the importance of complete cessation.

## 2015-09-13 NOTE — Patient Instructions (Signed)
Medication Instructions: Continue same medications.   Labwork: None.   Procedures/Testing: None.   Follow-Up: 6 months follow up with Dr. Arida.   Any Additional Special Instructions Will Be Listed Below (If Applicable).   

## 2015-09-13 NOTE — Assessment & Plan Note (Signed)
Renal artery duplex showed no evidence of renal artery stenosis. Blood pressure is now under excellent control since the addition of chlorthalidone. A follow-up basic metabolic profile showed normal renal function and electrolytes. I recommend continuing current medications and follow-up in 6 months.

## 2015-09-13 NOTE — Assessment & Plan Note (Addendum)
This was in the setting of uncontrolled hypertension. Treadmill stress test was normal.

## 2015-09-15 ENCOUNTER — Ambulatory Visit: Payer: Medicare Other | Admitting: Nurse Practitioner

## 2015-09-21 ENCOUNTER — Telehealth: Payer: Self-pay | Admitting: *Deleted

## 2015-09-21 ENCOUNTER — Telehealth: Payer: Self-pay

## 2015-09-21 NOTE — Telephone Encounter (Signed)
S/w pt daughter who inquires if pt can have teeth pulled. Informed daughter that if pt needs cardiac clearance before any procedure, have dental office contact us for clearance. Daughter verbalized understanding and states she will proceed w/scheduling appt.

## 2015-09-21 NOTE — Telephone Encounter (Signed)
Pt daughter calling stating pt needs to get her Teeth pulled but needs to know if patient is allowed to do this. Would like to know, please call.  Has not yet been scheduled.

## 2015-09-21 NOTE — Telephone Encounter (Signed)
Left message with CB number on pt daughter's VM

## 2016-03-16 ENCOUNTER — Ambulatory Visit (INDEPENDENT_AMBULATORY_CARE_PROVIDER_SITE_OTHER): Payer: Medicare Other | Admitting: Cardiovascular Disease

## 2016-03-16 ENCOUNTER — Encounter: Payer: Self-pay | Admitting: Cardiovascular Disease

## 2016-03-16 ENCOUNTER — Encounter (INDEPENDENT_AMBULATORY_CARE_PROVIDER_SITE_OTHER): Payer: Self-pay

## 2016-03-16 VITALS — BP 140/70 | HR 57 | Ht 65.0 in | Wt 126.2 lb

## 2016-03-16 DIAGNOSIS — I10 Essential (primary) hypertension: Secondary | ICD-10-CM

## 2016-03-16 DIAGNOSIS — R0789 Other chest pain: Secondary | ICD-10-CM

## 2016-03-16 NOTE — Progress Notes (Signed)
Cardiology Office Note   Date:  03/16/2016   ID:  Mary Reeves, DOB [birth year], MRN [id number]  PCP:  Emogene MorganAYCOCK, NGWE A, MD  Cardiologist:   Lorine BearsMuhammad Hurley Blevins, MD   Chief Complaint  Patient presents with  . Other    6 month f/u. Meds reviewed verbally with pt.      History of Present Illness: Mary Reeves is a 70 y.o. female who presents for  a follow-up visit regarding chest pain and hypertension. She reports having cardiac catheterization done in 2009 without revascularization.  She was seen in October, 2016 for difficult to control hypertension and atypical chest pain. She has known history of hyperlipidemia on treatment and prolonged history of tobacco use and possible COPD.  During her initial evaluation, her blood pressure was 170/88. I added chlorthalidone 25 mg once daily. Renal artery duplex showed no evidence of renal artery stenosis. She underwent a treadmill stress test which showed no evidence of ischemia. She was able to exercise for only 2-1/2 minutes.  She has been doing very well with no recurrent chest pain or shortness of breath. She is getting some dental work done. She stopped smoking recently and was congratulated on that.   Past Medical History  Diagnosis Date  . Hypertension   . Hyperlipidemia   . MI (myocardial infarction) (HCC)   . Coronary artery disease   . Kidney stones     Past Surgical History  Procedure Laterality Date  . Cardiac catheterization      [facility]  . Multiple tooth extractions       Current Outpatient Prescriptions  Medication Sig Dispense Refill  . albuterol (PROVENTIL HFA;VENTOLIN HFA) 108 (90 BASE) MCG/ACT inhaler Inhale 2 puffs into the lungs every 4 (four) hours as needed for wheezing or shortness of breath. 1 Inhaler 0  . aspirin 81 MG chewable tablet Chew 81 mg by mouth daily.    . chlorthalidone (HYGROTON) 25 MG tablet Take 1 tablet (25 mg total) by mouth daily. 30 tablet 5  . lisinopril (PRINIVIL,ZESTRIL) 40 MG  tablet Take 40 mg by mouth daily.    . metoprolol tartrate (LOPRESSOR) 25 MG tablet Take 1 tablet (25 mg total) by mouth 2 (two) times daily. 30 tablet 5  . simvastatin (ZOCOR) 20 MG tablet Take 20 mg by mouth daily.     No current facility-administered medications for this visit.    Allergies:   Penicillins    Social History:  The patient  reports that she has been smoking Cigarettes.  She has smoked for the past 50 years. She has never used smokeless tobacco. She reports that she does not drink alcohol or use illicit drugs.   Family History:  The patient's Family history is unknown by patient.    ROS:  Please see the history of present illness.   Otherwise, review of systems are positive for none.   All other systems are reviewed and negative.    PHYSICAL EXAM: VS:  BP 140/70 mmHg  Pulse 57  Ht 5\' 5"  (1.651 m)  Wt 126 lb 4 oz (57.267 kg)  BMI 21.01 kg/m2 , BMI Body mass index is 21.01 kg/(m^2). GEN: Well nourished, well developed, in no acute distress HEENT: normal Neck: no JVD, carotid bruits, or masses Cardiac: RRR; no murmurs, rubs, or gallops,no edema  Respiratory:  clear to auscultation bilaterally, normal work of breathing GI: soft, nontender, nondistended, + BS MS: no deformity or atrophy Skin: warm and dry, no rash Neuro:  Strength and sensation are intact Psych: euthymic mood, full affect   EKG:  EKG is ordered today. The ekg ordered today demonstrates sinus bradycardia with ST changes consistent with early repolarization.   Recent Labs: 05/14/2015: Hemoglobin 13.2; Platelets 327 07/12/2015: BUN 20; Creatinine, Ser 0.94; Potassium 4.8; Sodium 140    Lipid Panel No results found for: CHOL, TRIG, HDL, CHOLHDL, VLDL, LDLCALC, LDLDIRECT    Wt Readings from Last 3 Encounters:  03/16/16 126 lb 4 oz (57.267 kg)  09/13/15 127 lb 4 oz (57.72 kg)  06/28/15 132 lb 12 oz (60.215 kg)        ASSESSMENT AND PLAN:  1.  Essential hypertension: Blood pressure is  reasonably controlled. Continue same medications.  2. Previous chest pain: No recurrent symptoms since controlling her blood pressure. Negative treadmill stress test last year.  3. Tobacco use: The patient reports that she quit recently although with intermittent use of nicotine products. I advised her to quit completely.    Disposition:   She can follow-up with me as needed if she has recurrent chest pain or uncontrolled hypertension. Otherwise she should continue to follow-up with Dr. Letta Pate regarding her chronic medical conditions.  Signed,  Lorine Bears, MD  03/16/2016 11:24 AM    [facility]

## 2016-03-16 NOTE — Patient Instructions (Signed)
Medication Instructions: Continue same medications.   Labwork: None.   Procedures/Testing: None.   Follow-Up: As needed with Dr. Arida.   Any Additional Special Instructions Will Be Listed Below (If Applicable).     If you need a refill on your cardiac medications before your next appointment, please call your pharmacy.   

## 2016-04-14 ENCOUNTER — Other Ambulatory Visit: Payer: Self-pay | Admitting: Family Medicine

## 2016-04-14 DIAGNOSIS — Z1231 Encounter for screening mammogram for malignant neoplasm of breast: Secondary | ICD-10-CM

## 2016-04-14 DIAGNOSIS — Z780 Asymptomatic menopausal state: Secondary | ICD-10-CM

## 2016-06-05 ENCOUNTER — Other Ambulatory Visit: Payer: Self-pay | Admitting: Family Medicine

## 2016-06-05 DIAGNOSIS — Z1231 Encounter for screening mammogram for malignant neoplasm of breast: Secondary | ICD-10-CM

## 2016-06-05 DIAGNOSIS — Z780 Asymptomatic menopausal state: Secondary | ICD-10-CM

## 2016-07-04 ENCOUNTER — Ambulatory Visit: Payer: Medicare Other | Attending: Family Medicine

## 2016-07-04 ENCOUNTER — Other Ambulatory Visit: Payer: Medicare Other

## 2016-08-09 IMAGING — CR DG CHEST 2V
2 series · 2 of 2 positions shown · non-contrast
Comparison: April 19, 2010.

CLINICAL DATA: Acute chest pain.

EXAM:
CHEST  2 VIEW

[chest pa]
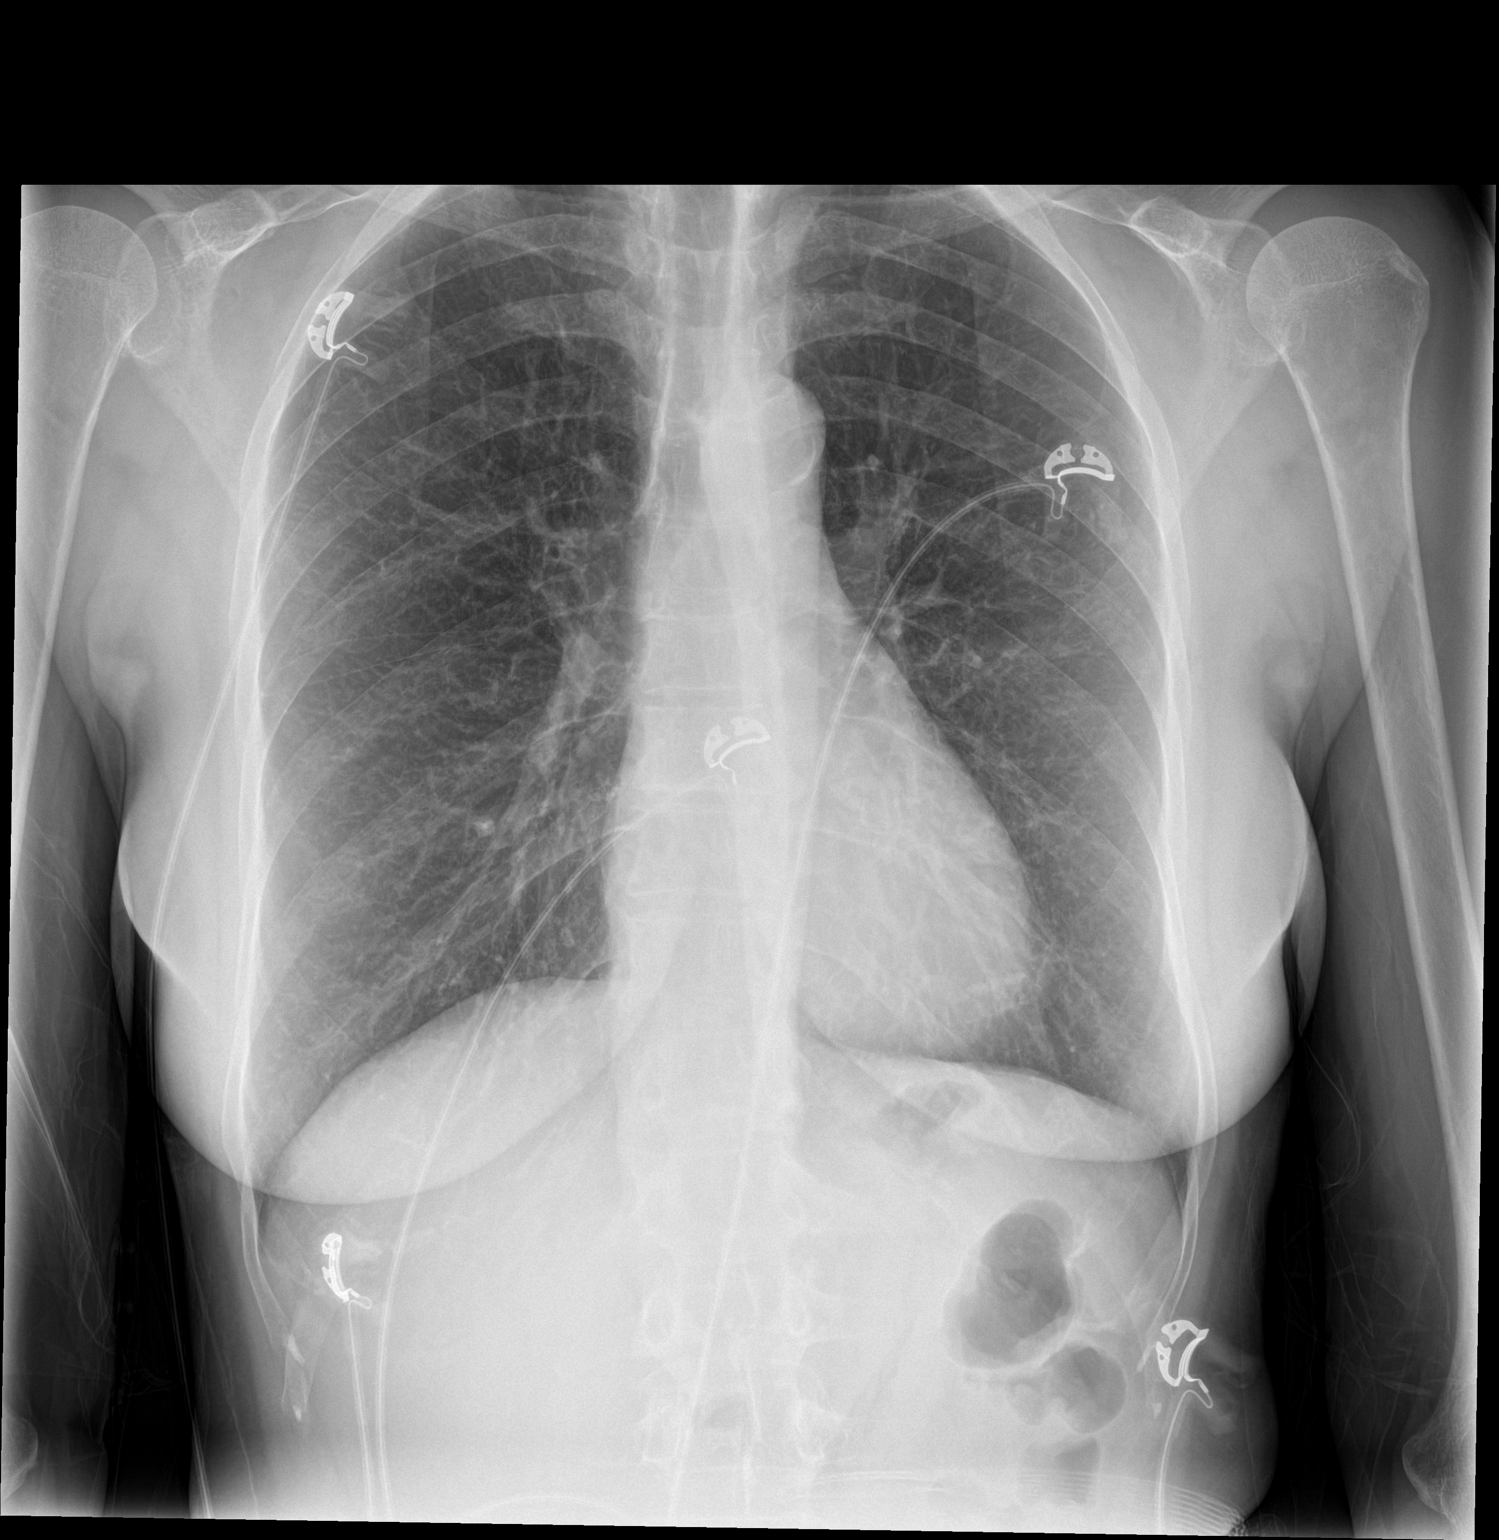

[chest lat]
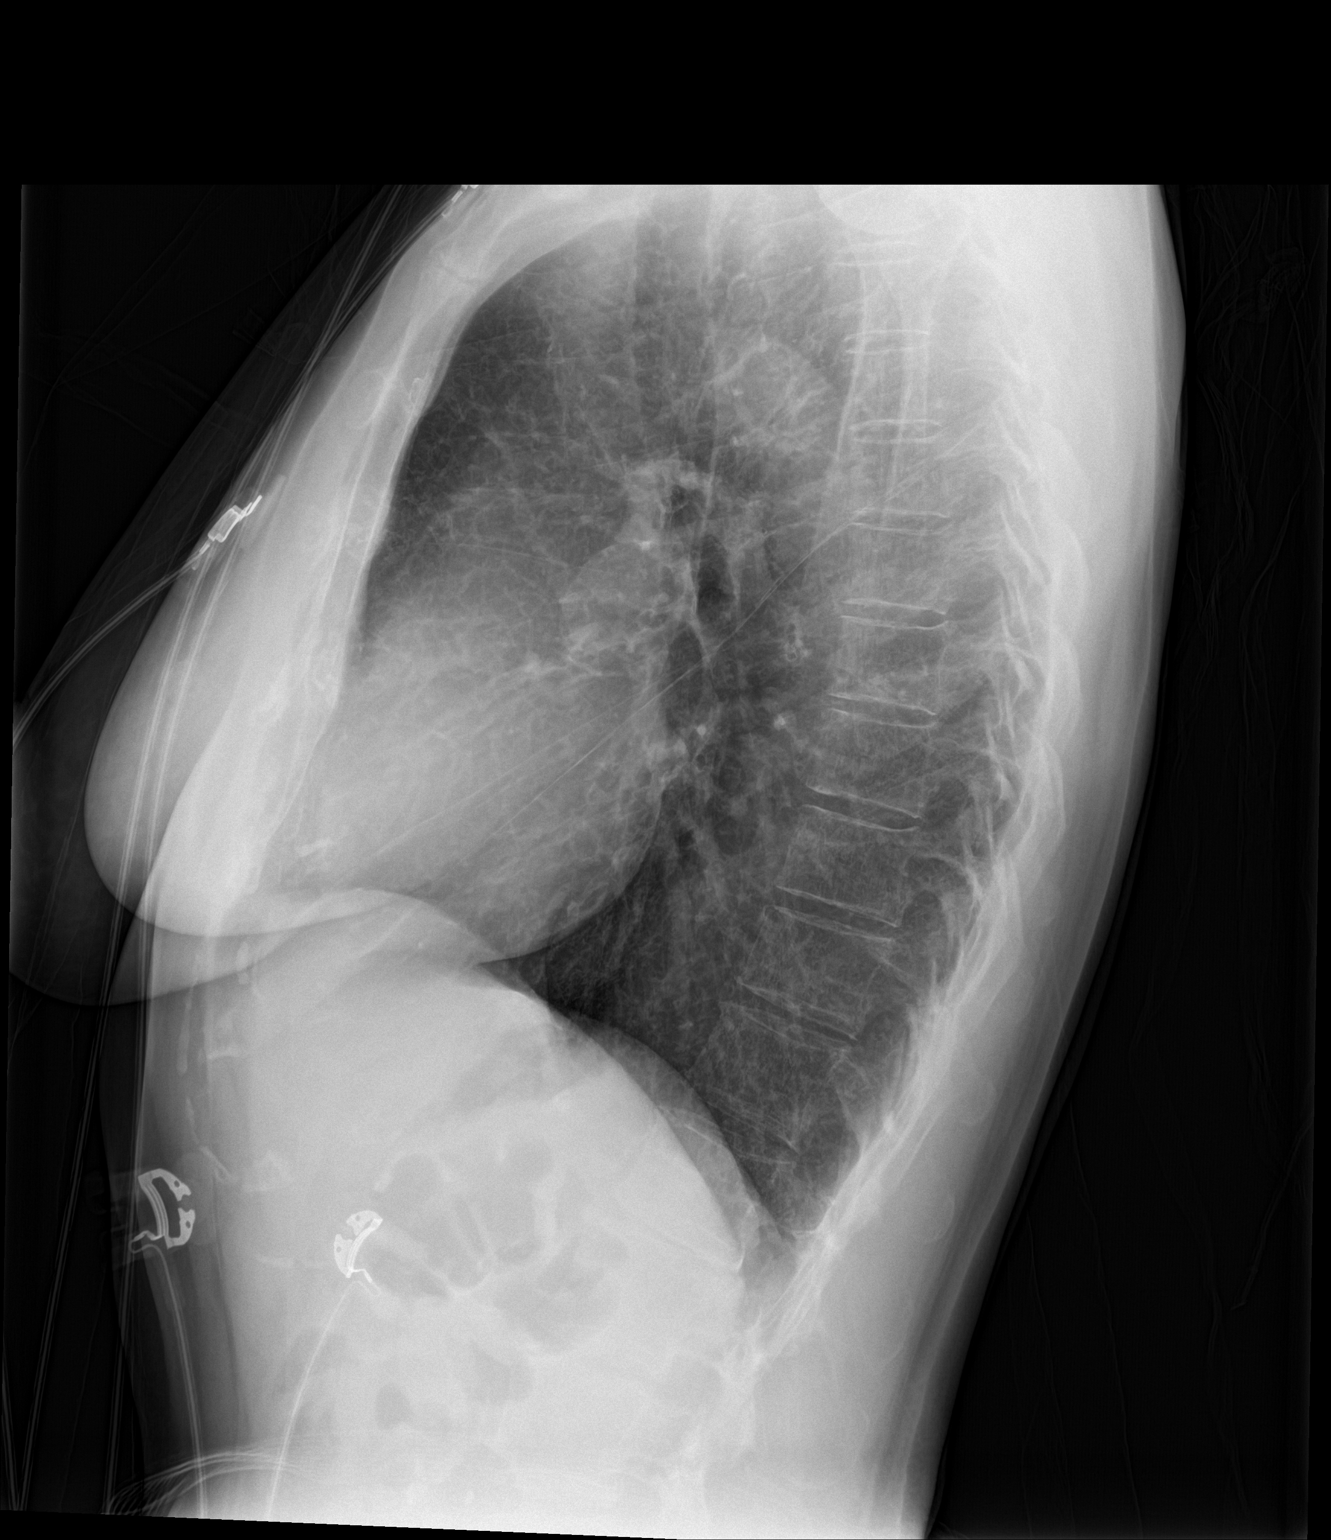

[2 of 2 positions shown; findings below may reference images not displayed]

FINDINGS: The heart size and mediastinal contours are within normal limits.
Both lungs are clear. No pneumothorax or pleural effusion is noted.
The visualized skeletal structures are unremarkable.
IMPRESSION: No active cardiopulmonary disease.

## 2016-08-10 IMAGING — CT CT ANGIO CHEST
1 of 2 series · 18 of 30 positions shown · IV contrast (APPLIED)
Comparison: None.

CLINICAL DATA: Chest pain starting last night. Left-sided chest
pain with movement. Elevated D-dimer.

EXAM:
CT ANGIOGRAPHY CHEST WITH CONTRAST
TECHNIQUE: Multidetector CT imaging of the chest was performed using the
standard protocol during bolus administration of intravenous
contrast. Multiplanar CT image reconstructions and MIPs were
obtained to evaluate the vascular anatomy.
CONTRAST:  75mL OMNIPAQUE IOHEXOL 350 MG/ML SOLN

[Series 5: pe 1.0 thins · axial · 0.61mm/px · z∈[-282,+0]mm · 18 of 319 slices shown]
[im 18/319  lung]
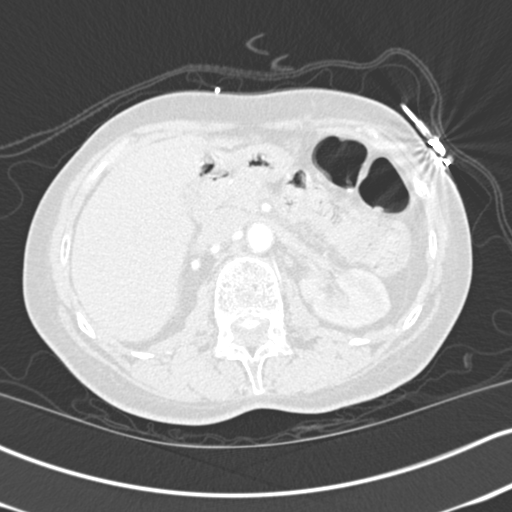
[im 36/319  mediastinal]
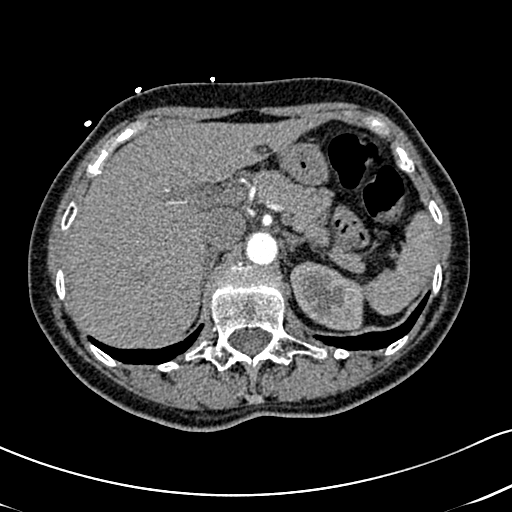
[im 54/319  lung]
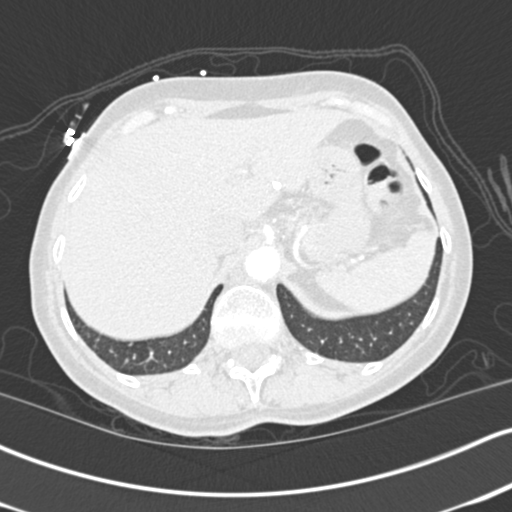
[im 71/319  mediastinal]
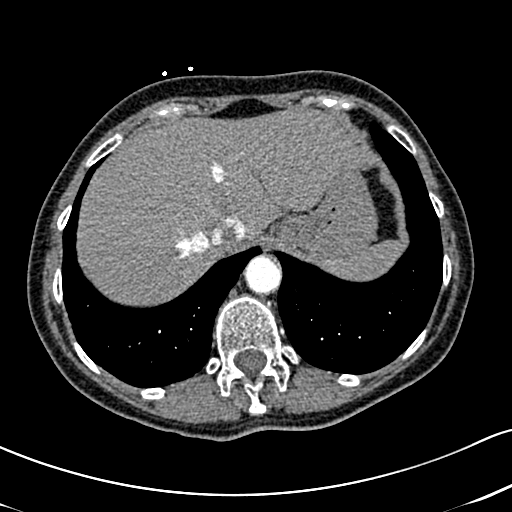
[im 89/319  lung]
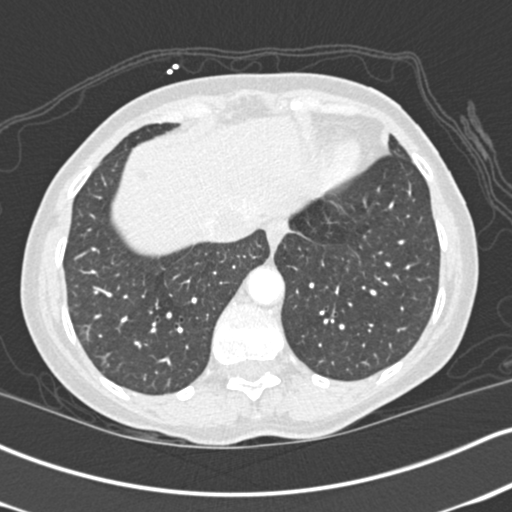
[im 107/319  mediastinal]
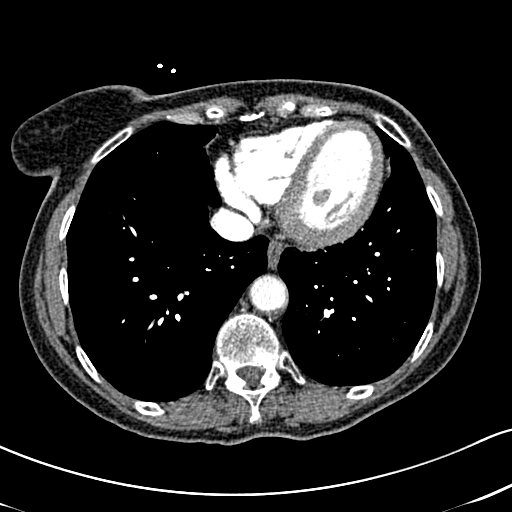
[im 124/319  lung]
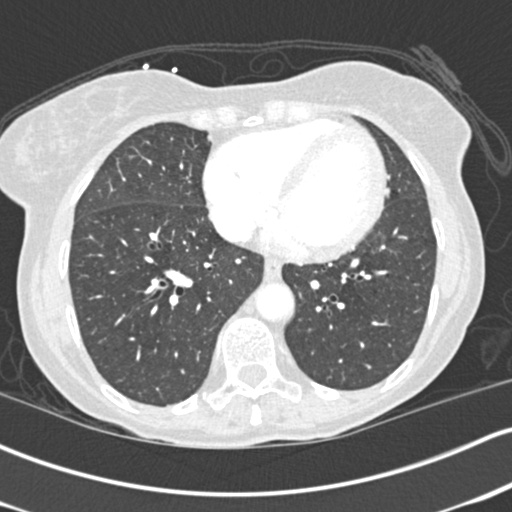
[im 142/319  mediastinal]
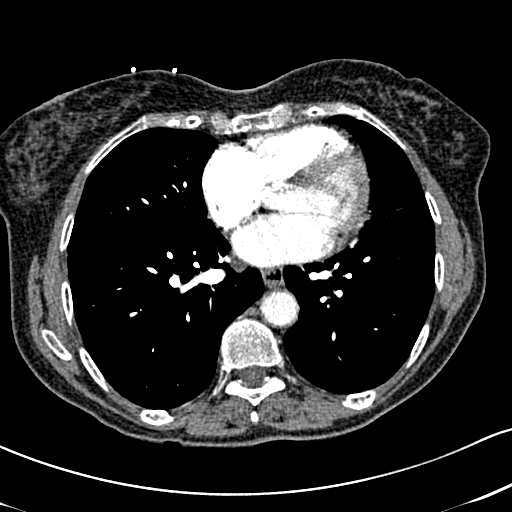
[im 150/319  lung]
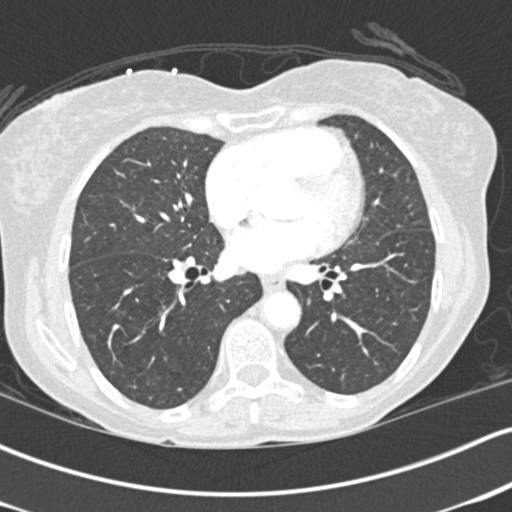
[im 160/319  mediastinal]
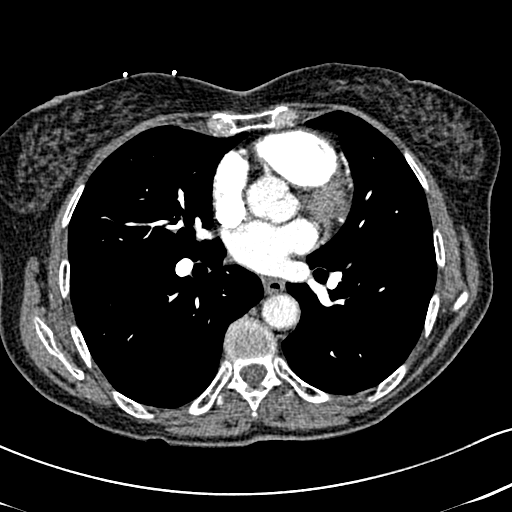
[im 177/319  lung]
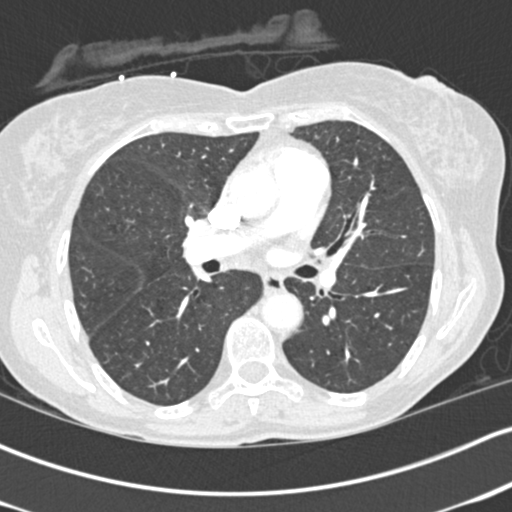
[im 195/319  mediastinal]
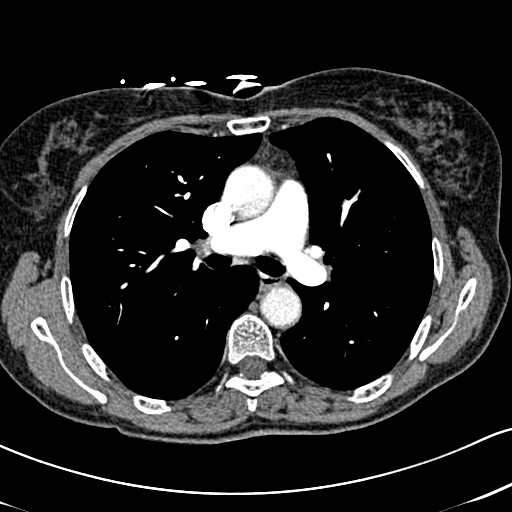
[im 213/319  lung]
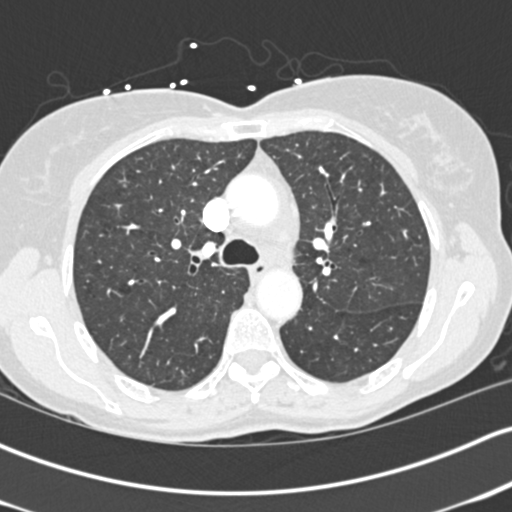
[im 230/319  mediastinal]
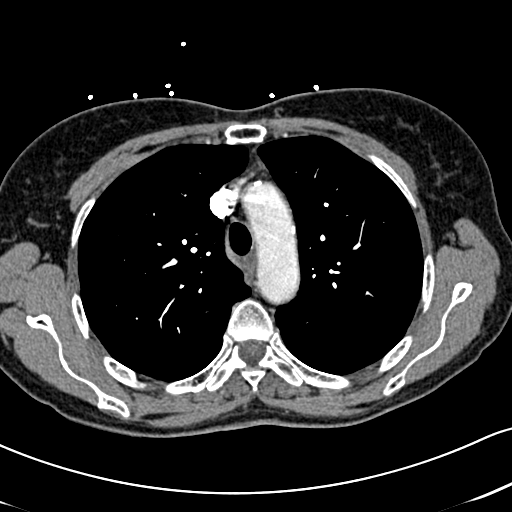
[im 248/319  lung]
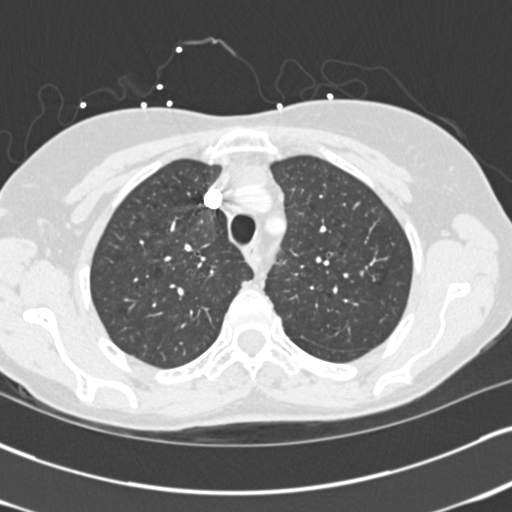
[im 266/319  mediastinal]
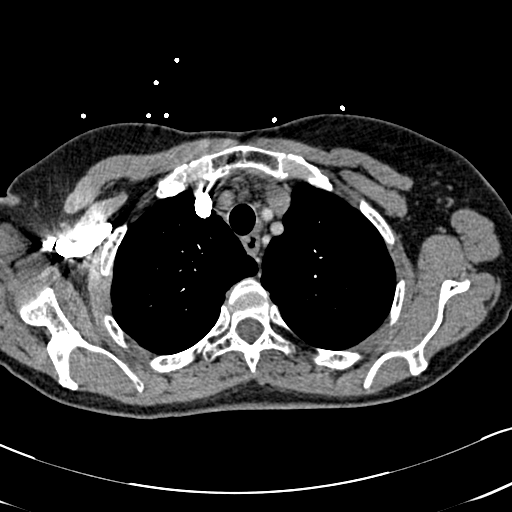
[im 283/319  lung]
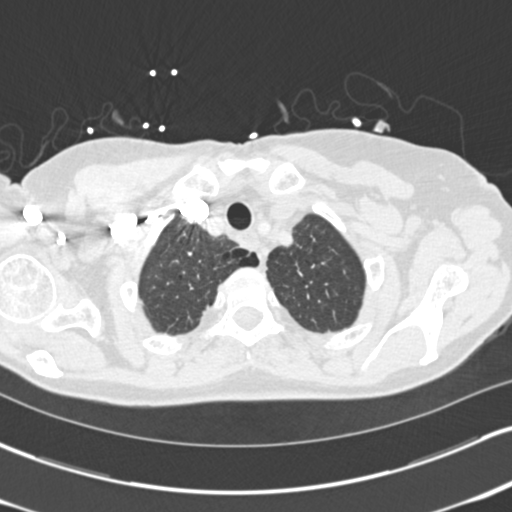
[im 301/319  mediastinal]
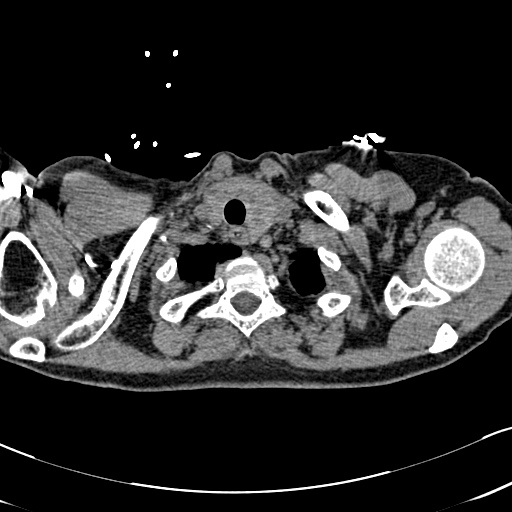

[18 of 30 positions shown; findings below may reference images not displayed]

FINDINGS: Technically adequate study with good opacification of the central
and segmental pulmonary arteries. No focal filling defects are
demonstrated. No evidence of significant pulmonary embolus.

Normal heart size. Normal caliber thoracic aorta. No aortic
dissection. Great vessel origins are patent. Moderate diffuse
enlargement of the thyroid gland. No significant lymphadenopathy in
the chest. Esophagus is decompressed.

Mild emphysematous changes in the lungs. No focal airspace disease
or consolidation in the lungs. Airways appear patent. No pleural
effusions. No pneumothorax.

Included portions abdominal organs demonstrate 2 small
low-attenuation lesions in the dome of the liver, likely
representing cysts although too small to characterize. Prominent
disc osteophyte complex at T12-L1. No destructive bone lesions.

Review of the MIP images confirms the above findings.
IMPRESSION: No evidence of significant pulmonary embolus. Emphysematous changes
in the lungs. No evidence of active consolidation.

## 2016-10-23 ENCOUNTER — Other Ambulatory Visit: Payer: Self-pay

## 2016-10-23 DIAGNOSIS — I10 Essential (primary) hypertension: Secondary | ICD-10-CM

## 2016-10-23 MED ORDER — CHLORTHALIDONE 25 MG PO TABS: 25.0000 mg | ORAL_TABLET | Freq: Every day | ORAL | 0 refills | Status: DC

## 2016-10-23 NOTE — Telephone Encounter (Signed)
Refill sent for Chlorthalidone 25 mg one tablet daily.

## 2016-10-24 ENCOUNTER — Other Ambulatory Visit: Payer: Self-pay

## 2016-10-24 NOTE — Telephone Encounter (Signed)
Pharmacy called asking if we received a faxed refill request for metoprolol tartrate. I let her know I haven't received the fax. She was able to take a verbal refill order. Pt has 3 refills as of today.

## 2016-10-25 DIAGNOSIS — J3089 Other allergic rhinitis: Secondary | ICD-10-CM | POA: Diagnosis not present

## 2017-01-04 ENCOUNTER — Telehealth: Payer: Self-pay | Admitting: Cardiovascular Disease

## 2017-01-04 ENCOUNTER — Other Ambulatory Visit: Payer: Self-pay | Admitting: Cardiovascular Disease

## 2017-01-04 DIAGNOSIS — I10 Essential (primary) hypertension: Secondary | ICD-10-CM

## 2017-01-04 MED ORDER — CHLORTHALIDONE 25 MG PO TABS: 25.0000 mg | ORAL_TABLET | Freq: Every day | ORAL | 3 refills | Status: DC

## 2017-01-04 MED ORDER — CHLORTHALIDONE 25 MG PO TABS: 3.0000 mg | ORAL_TABLET | Freq: Every day | ORAL | 3 refills | Status: DC

## 2017-01-04 NOTE — Telephone Encounter (Signed)
error 

## 2017-01-10 DIAGNOSIS — J069 Acute upper respiratory infection, unspecified: Secondary | ICD-10-CM | POA: Diagnosis not present

## 2017-01-10 DIAGNOSIS — J019 Acute sinusitis, unspecified: Secondary | ICD-10-CM | POA: Diagnosis not present

## 2017-04-25 ENCOUNTER — Other Ambulatory Visit: Payer: Self-pay

## 2017-04-25 MED ORDER — METOPROLOL TARTRATE 25 MG PO TABS: 25.0000 mg | ORAL_TABLET | Freq: Two times a day (BID) | ORAL | 0 refills | Status: DC

## 2017-04-25 NOTE — Telephone Encounter (Signed)
Requested Prescriptions   Signed Prescriptions Disp Refills  . metoprolol tartrate (LOPRESSOR) 25 MG tablet 60 tablet 0    Sig: Take 1 tablet (25 mg total) by mouth 2 (two) times daily.    Authorizing Provider: Lorine Bears A    Ordering User: Margrett Rud

## 2017-05-02 ENCOUNTER — Encounter: Payer: Self-pay | Admitting: Physician Assistant

## 2017-05-02 NOTE — Progress Notes (Signed)
Cardiology Office Note Date:  05/03/2017  Patient ID:  Mary Reeves, DOB 10-19-1945, MRN [id number] PCP:  Emogene Morgan, MD  Cardiologist:  Dr. Kirke Corin, MD    Chief Complaint: Follow up HTN  History of Present Illness: Mary M Knierim is a 71 y.o. female with history of difficult to control essential hypertension, hyperlipidemia, coronary artery disease medically managed by self reported cardiac catheterization in 2009, tobacco abuse with possible COPD and atypical chest pain who presents for follow-up of hypertension.   Patient was previously evaluated and October, 2016 for difficult to control hypertension and atypical chest pain. At that time her blood pressure was noted to be 170/88. Renal artery duplex showed no evidence of renal artery stenosis. She underwent treadmill stress test which showed no evidence of ischemia. She was only able to exercise for 2.5 minutes. Chlorthalidone 25 mg daily was added. She was most recently seen and 03/2016 for routine follow-up and was doing well at that time. It was noted she had quit smoking at that time as well. Blood pressure was 140/70.   Most recent labs from 07/2015 indicate normal bmet.  She comes in doing well today. No concerns or complaints. Blood pressure has remained well controlled at home. Tolerating all medications without issues. No chest pain. Continues to walk around her neighborhood and at a local track daily. Working on quitting smoking. Considering trying gum.   Past Medical History:  Diagnosis Date  . Coronary artery disease, non-occlusive    a. 11/16 treadmill stress test without evidence of ischemia, exercised 2.5 minutes  . Hyperlipidemia   . Hypertension    a. 2016 renal artery ultrasound negative for RAS  . Kidney stones     Past Surgical History:  Procedure Laterality Date  . CARDIAC CATHETERIZATION     [facility]  . MULTIPLE TOOTH EXTRACTIONS      Current Meds  Medication Sig  . albuterol (PROVENTIL  HFA;VENTOLIN HFA) 108 (90 BASE) MCG/ACT inhaler Inhale 2 puffs into the lungs every 4 (four) hours as needed for wheezing or shortness of breath.  Marland Kitchen aspirin 81 MG chewable tablet Chew 81 mg by mouth daily.  . chlorthalidone (HYGROTON) 25 MG tablet Take 1 tablet (25 mg total) by mouth daily.  Marland Kitchen lisinopril (PRINIVIL,ZESTRIL) 40 MG tablet Take 40 mg by mouth daily.  . metoprolol tartrate (LOPRESSOR) 25 MG tablet Take 1 tablet (25 mg total) by mouth 2 (two) times daily.  . simvastatin (ZOCOR) 20 MG tablet Take 20 mg by mouth daily.    Allergies:   Penicillins   Social History:  The patient  reports that she has been smoking Cigarettes.  She has smoked for the past 50.00 years. She has never used smokeless tobacco. She reports that she does not drink alcohol or use drugs.   Family History:  The patient's Family history is unknown by patient.  ROS:   Review of Systems  Constitutional: Negative for chills, diaphoresis, fever, malaise/fatigue and weight loss.  HENT: Negative for congestion.   Eyes: Negative for discharge and redness.  Respiratory: Negative for cough, hemoptysis, sputum production, shortness of breath and wheezing.   Cardiovascular: Negative for chest pain, palpitations, orthopnea, claudication, leg swelling and PND.  Gastrointestinal: Negative for abdominal pain, blood in stool, heartburn, melena, nausea and vomiting.  Genitourinary: Negative for hematuria.  Musculoskeletal: Negative for falls and myalgias.  Skin: Negative for rash.  Neurological: Negative for dizziness, tingling, tremors, sensory change, speech change, focal weakness, loss of consciousness and weakness.  Endo/Heme/Allergies: Does not bruise/bleed easily.  Psychiatric/Behavioral: Negative for substance abuse. The patient is not nervous/anxious.   All other systems reviewed and are negative.    PHYSICAL EXAM:  VS:  BP 104/64 (BP Location: Left Arm, Patient Position: Sitting, Cuff Size: Normal)   Pulse (!) 58    Ht 5\' 5"  (1.651 m)   Wt 130 lb 8 oz (59.2 kg)   BMI 21.72 kg/m  BMI: Body mass index is 21.72 kg/m.  Physical Exam  Constitutional: She is oriented to person, place, and time. She appears well-developed and well-nourished.  HENT:  Head: Normocephalic and atraumatic.  Eyes: Right eye exhibits no discharge. Left eye exhibits no discharge.  Neck: Normal range of motion. No JVD present.  Cardiovascular: Regular rhythm, S1 normal, S2 normal and normal heart sounds.  Bradycardia present.  Exam reveals no distant heart sounds, no friction rub, no midsystolic click and no opening snap.   No murmur heard. Pulmonary/Chest: Effort normal and breath sounds normal. No respiratory distress. She has no decreased breath sounds. She has no wheezes. She has no rales. She exhibits no tenderness.  Abdominal: Soft. She exhibits no distension. There is no tenderness.  Musculoskeletal: She exhibits no edema.  Neurological: She is alert and oriented to person, place, and time.  Skin: Skin is warm and dry. No cyanosis. Nails show no clubbing.  Psychiatric: She has a normal mood and affect. Her speech is normal and behavior is normal. Judgment and thought content normal.     EKG:  Was ordered and interpreted by me today. Shows sinus bradycardia, 58 bpm, no acute st/t changes   Recent Labs: No results found for requested labs within last 8760 hours.  No results found for requested labs within last 8760 hours.   CrCl cannot be calculated (Patient's most recent lab result is older than the maximum 21 days allowed.).   Wt Readings from Last 3 Encounters:  05/03/17 130 lb 8 oz (59.2 kg)  03/16/16 126 lb 4 oz (57.3 kg)  09/13/15 127 lb 4 oz (57.7 kg)     Other studies reviewed: Additional studies/records reviewed today include: summarized above  ASSESSMENT AND PLAN:  1. Essential hypertension: Blood pressure well controlled. Continue chlorthalidone, lisinopril, metoprolol tartrate without changes. Refill  medications. Check bmet.  2. Previous atypical chest pain: Resolved. Prior negative stress test.No plans for further ischemic evaluation.  3. Tobacco abuse: Interested in quitting. Considering starting gum.  Disposition: F/u with Dr. Kirke CorinArida, MD in 6 months.   Current medicines are reviewed at length with the patient today.  The patient did not have any concerns regarding medicines.  Elinor DodgeSigned, Tephanie Escorcia PA-C 05/03/2017 9:25 AM     [facility] [address] [place], [place] [address] [phone]

## 2017-05-03 ENCOUNTER — Encounter: Payer: Self-pay | Admitting: Physician Assistant

## 2017-05-03 ENCOUNTER — Ambulatory Visit (INDEPENDENT_AMBULATORY_CARE_PROVIDER_SITE_OTHER): Payer: Medicare Other | Admitting: Physician Assistant

## 2017-05-03 VITALS — BP 104/64 | HR 58 | Ht 65.0 in | Wt 130.5 lb

## 2017-05-03 DIAGNOSIS — I10 Essential (primary) hypertension: Secondary | ICD-10-CM | POA: Diagnosis not present

## 2017-05-03 DIAGNOSIS — R0789 Other chest pain: Secondary | ICD-10-CM | POA: Diagnosis not present

## 2017-05-03 DIAGNOSIS — Z720 Tobacco use: Secondary | ICD-10-CM

## 2017-05-03 MED ORDER — CHLORTHALIDONE 25 MG PO TABS
25.0000 mg | ORAL_TABLET | Freq: Every day | ORAL | 6 refills | Status: DC
Start: 2017-05-03 — End: 2018-08-12

## 2017-05-03 MED ORDER — LISINOPRIL 40 MG PO TABS
40.0000 mg | ORAL_TABLET | Freq: Every day | ORAL | 3 refills | Status: DC
Start: 2017-05-03 — End: 2018-08-12

## 2017-05-03 MED ORDER — METOPROLOL TARTRATE 25 MG PO TABS: 25.0000 mg | ORAL_TABLET | Freq: Two times a day (BID) | ORAL | 6 refills | Status: DC

## 2017-05-03 NOTE — Patient Instructions (Signed)
Medication Instructions:  Refills sent electronically to pharmacy  Labwork: We will call you with your results.   Follow-Up: Your physician wants you to follow-up in: 6 months with Dr. Kirke CorinArida. You will receive a reminder letter in the mail two months in advance. If you don't receive a letter, please call our office to schedule the follow-up appointment.  It was a pleasure seeing you today here in the office. Please do not hesitate to give us a call back if you have any further questions. [phone]  [place] CellarPamela A. RN, BSN

## 2017-05-04 ENCOUNTER — Telehealth: Payer: Self-pay | Admitting: *Deleted

## 2017-05-04 DIAGNOSIS — I10 Essential (primary) hypertension: Secondary | ICD-10-CM

## 2017-05-04 LAB — BASIC METABOLIC PANEL
BUN / CREAT RATIO: 15 (ref 12–28)
BUN: 17 mg/dL (ref 8–27)
CO2: 22 mmol/L (ref 20–29)
CREATININE: 1.14 mg/dL — AB (ref 0.57–1.00)
Calcium: 9.7 mg/dL (ref 8.7–10.3)
Chloride: 99 mmol/L (ref 96–106)
GFR calc Af Amer: 57 mL/min/{1.73_m2} — ABNORMAL LOW (ref >59)
GFR calc non Af Amer: 50 mL/min/{1.73_m2} — ABNORMAL LOW (ref >59)
GLUCOSE: 63 mg/dL — AB (ref 65–99)
Potassium: 4.8 mmol/L (ref 3.5–5.2)
Sodium: 137 mmol/L (ref 134–144)

## 2017-05-04 NOTE — Telephone Encounter (Signed)
-----   Message from Sondra Bargesyan M Dunn, PA-C sent at 05/04/2017  7:54 AM EDT ----- Please call the patient. Renal function slightly increased from value in late 2016. Increase fluids and recheck bmet in 2 weeks.

## 2017-05-04 NOTE — Telephone Encounter (Signed)
Reviewed results and recommendations with patient. Order placed for repeat labs and reviewed with patient to have done [facility]. She verbalized understanding of our conversation, agreement with plan, and had no further questions at this time.

## 2018-07-15 ENCOUNTER — Other Ambulatory Visit: Payer: Self-pay

## 2018-07-15 NOTE — Telephone Encounter (Signed)
*  STAT* If patient is at the pharmacy, call can be transferred to refill team.   1. Which medications need to be refilled? (please list name of each medication and dose if known) chlorthaliadone, 25 mg, lisinopril 40 mg, metoprolo 25 mg. Simvastatin 20 mg  2. Which pharmacy/location (including street and city if local pharmacy) is medication to be sent to[facility]  3. Do they need a 30 day or 90 day supply? 90

## 2018-07-15 NOTE — Telephone Encounter (Signed)
Patient needs an appointment for refills. Should have had labs drawn after starting medications, and has not been seen since 8/18 she should have been out of the requested medications after 6 mo. 2/19.

## 2018-08-12 ENCOUNTER — Other Ambulatory Visit: Payer: Self-pay

## 2018-08-12 MED ORDER — CHLORTHALIDONE 25 MG PO TABS: 25.0000 mg | ORAL_TABLET | Freq: Every day | ORAL | 0 refills | Status: DC

## 2018-08-12 MED ORDER — METOPROLOL TARTRATE 25 MG PO TABS: 25.0000 mg | ORAL_TABLET | Freq: Two times a day (BID) | ORAL | 0 refills | Status: DC

## 2018-08-12 MED ORDER — SIMVASTATIN 20 MG PO TABS: 20.0000 mg | ORAL_TABLET | Freq: Every day | ORAL | 0 refills | Status: DC

## 2018-08-12 MED ORDER — LISINOPRIL 40 MG PO TABS: 40.0000 mg | ORAL_TABLET | Freq: Every day | ORAL | 0 refills | Status: DC

## 2018-08-12 NOTE — Telephone Encounter (Signed)
*  STAT* If patient is at the pharmacy, call can be transferred to refill team.   1. Which medications need to be refilled? (please list name of each medication and dose if known)  Chlorthalidone 25 mg, Lisinopri 40 mg, Metoprolol 25 mg, Simvastatin 20 mg 2. Which pharmacy/location (including street and city if local pharmacy) is medication to be sent to? [facility]  3. Do they need a 30 day or 90 day supply? 90

## 2018-08-13 ENCOUNTER — Telehealth: Payer: Self-pay | Admitting: Cardiovascular Disease

## 2018-08-13 NOTE — Telephone Encounter (Signed)
°*  STAT* If patient is at the pharmacy, call can be transferred to refill team.   1. Which medications need to be refilled? (please list name of each medication and dose if known)    Chlorthalidone 25 mg, Lisinopri 40 mg, Metoprolol 25 mg, Simvastatin 20 mg  2. Which pharmacy/location (including street and city if local pharmacy) is medication to be sent to? [facility]   3. Do they need a 30 day or 90 day supply? 90   Fu appt scheduled

## 2018-08-29 ENCOUNTER — Ambulatory Visit: Payer: Medicare Other | Admitting: Nurse Practitioner

## 2018-08-30 ENCOUNTER — Ambulatory Visit (INDEPENDENT_AMBULATORY_CARE_PROVIDER_SITE_OTHER): Payer: Medicare Other | Admitting: Nurse Practitioner

## 2018-08-30 ENCOUNTER — Encounter: Payer: Self-pay | Admitting: Nurse Practitioner

## 2018-08-30 VITALS — BP 140/80 | HR 54 | Ht 65.0 in | Wt 129.0 lb

## 2018-08-30 DIAGNOSIS — E782 Mixed hyperlipidemia: Secondary | ICD-10-CM

## 2018-08-30 DIAGNOSIS — I10 Essential (primary) hypertension: Secondary | ICD-10-CM

## 2018-08-30 DIAGNOSIS — Z720 Tobacco use: Secondary | ICD-10-CM

## 2018-08-30 MED ORDER — METOPROLOL TARTRATE 25 MG PO TABS: 25.0000 mg | ORAL_TABLET | Freq: Two times a day (BID) | ORAL | 3 refills | Status: DC

## 2018-08-30 MED ORDER — LISINOPRIL 40 MG PO TABS: 40.0000 mg | ORAL_TABLET | Freq: Every day | ORAL | 3 refills | Status: DC

## 2018-08-30 MED ORDER — SIMVASTATIN 20 MG PO TABS: 20.0000 mg | ORAL_TABLET | Freq: Every day | ORAL | 3 refills | Status: DC

## 2018-08-30 MED ORDER — CHLORTHALIDONE 25 MG PO TABS: 25.0000 mg | ORAL_TABLET | Freq: Every day | ORAL | 3 refills | Status: DC

## 2018-08-30 NOTE — Patient Instructions (Signed)
Medication Instructions:  Your physician recommends that you continue on your current medications as directed. Please refer to the Current Medication list given to you today.  If you need a refill on your cardiac medications before your next appointment, please call your pharmacy.   Lab work: Your physician recommends that you return for lab work today (CMET, Lipid, direct LDL)   If you have labs (blood work) drawn today and your tests are completely normal, you will receive your results only by: Marland Kitchen. MyChart Message (if you have MyChart) OR . A paper copy in the mail If you have any lab test that is abnormal or we need to change your treatment, we will call you to review the results.  Testing/Procedures: None ordered   Follow-Up: At [facility], you and your health needs are our priority.  As part of our continuing mission to provide you with exceptional heart care, we have created designated Provider Care Teams.  These Care Teams include your primary Cardiologist (physician) and Advanced Practice Providers (APPs -  Physician Assistants and Nurse Practitioners) who all work together to provide you with the care you need, when you need it. You will need a follow up appointment in 1 years.  Please call our office 2 months in advance to schedule this appointment.  You may see Lorine BearsMuhammad Arida, MD or one of the following Advanced Practice Providers on your designated Care Team:   Nicolasa Duckinghristopher Berge, NP Eula Listenyan Dunn, PA-C . Marisue IvanJacquelyn Visser, PA-C

## 2018-08-30 NOTE — Progress Notes (Signed)
Office Visit    Patient Name: [place] M Oelkers Date of Encounter: 08/30/2018  Primary Care Provider:  Emogene MorganAycock, Ngwe A, MD Primary Cardiologist:  Lorine BearsMuhammad Arida, MD  Chief Complaint    72 year old female with a history of hypertension, hyperlipidemia, nonobstructive CAD, tobacco abuse, and atypical chest pain, who presents for follow-up related to hypertension.  Past Medical History    Past Medical History:  Diagnosis Date  . Coronary artery disease, non-occlusive    a. 2009 Cath: reportedly nonobstructive; b. 07/2015 treadmill stress test without evidence of ischemia, exercised 2.5 minutes  . Hyperlipidemia   . Hypertension    a. 2016 renal artery ultrasound negative for RAS  . Kidney stones    Past Surgical History:  Procedure Laterality Date  . CARDIAC CATHETERIZATION     [facility]  . MULTIPLE TOOTH EXTRACTIONS      Allergies  Allergies  Allergen Reactions  . Penicillins Rash    History of Present Illness    72 year old female with a history of hypertension, hyperlipidemia, atypical chest pain, nonobstructive CAD, and tobacco abuse.  She was previous evaluated in 2016 for atypical chest pain and difficult to control hypertension.  Exercise treadmill testing at that time showed no evidence of ischemia though exercise tolerance was poor-2 minutes and 30 seconds.  She also underwent renal artery duplex which was negative for renal artery stenosis.  She was last seen in clinic in August 2018, at which time blood pressure was well controlled and she was doing well.  Since then, she has continued to do well, walking 30 minutes daily without symptoms or limitations.  She is using nicotine gum regularly and has cut back cigarettes to 2 cigarettes a week.  She is hopeful to quit completely in the new year.  She occasionally monitors blood pressure at home and says that she typically runs in the 120s.  Her blood pressure is elevated today however, she thinks is because she was running  late and sitting in traffic.  She denies chest pain, dyspnea, palpitations, PND, orthopnea, dizziness, syncope, edema, or early satiety.  Home Medications    Prior to Admission medications   Medication Sig Start Date End Date Taking? Authorizing Provider  albuterol (PROVENTIL HFA;VENTOLIN HFA) 108 (90 BASE) MCG/ACT inhaler Inhale 2 puffs into the lungs every 4 (four) hours as needed for wheezing or shortness of breath. 05/15/15   Irean HongSung, Jade J, MD  aspirin 81 MG chewable tablet Chew 81 mg by mouth daily.    [provider]  chlorthalidone (HYGROTON) 25 MG tablet Take 1 tablet (25 mg total) by mouth daily. 08/12/18 11/10/18  Antonieta IbaGollan, Timothy J, MD  lisinopril (PRINIVIL,ZESTRIL) 40 MG tablet Take 1 tablet (40 mg total) by mouth daily. 08/12/18   Antonieta IbaGollan, Timothy J, MD  metoprolol tartrate (LOPRESSOR) 25 MG tablet Take 1 tablet (25 mg total) by mouth 2 (two) times daily. 08/12/18   Antonieta IbaGollan, Timothy J, MD  simvastatin (ZOCOR) 20 MG tablet Take 1 tablet (20 mg total) by mouth daily. 08/12/18   Antonieta IbaGollan, Timothy J, MD    Review of Systems    Doing well over the past year.  She denies chest pain, palpitations, dyspnea, pnd, orthopnea, n, v, dizziness, syncope, edema, weight gain, or early satiety.  All other systems reviewed and are otherwise negative except as noted above.  Physical Exam    VS:  BP (!) 158/82   Pulse (!) 54   Ht 5\' 5"  (1.651 m)   Wt 129 lb (58.5  kg)   SpO2 99%   BMI 21.47 kg/m  , BMI Body mass index is 21.47 kg/m.  Repeat blood pressure 140/80 GEN: Well nourished, well developed, in no acute distress. HEENT: normal. Neck: Supple, no JVD, carotid bruits, or masses. Cardiac: RRR, no murmurs, rubs, or gallops. No clubbing, cyanosis, edema.  Radials/DP/PT 2+ and equal bilaterally.  Respiratory:  Respirations regular and unlabored, clear to auscultation bilaterally. GI: Soft, nontender, nondistended, BS + x 4. MS: no deformity or atrophy. Skin: warm and dry, no rash. Neuro:   Strength and sensation are intact. Psych: Normal affect.  Accessory Clinical Findings    ECG personally reviewed by me today -sinus bradycardia, 54, LVH - no acute changes.  Assessment & Plan    1.  Essential hypertension: Patient has been feeling well over the past year and says that she checks her blood pressure 4 times a week with numbers typically in the 120s.  She was 158/82 on arrival today and 140/80 on repeat.  She just says that she was nervous about missing her appointment because she was stuck in traffic and ended up being late.  She also ate a sausage, egg, and cheese biscuit this morning.  She needs refills on her medications and I will follow-up a basic metabolic panel today.  We will plan to continue chlorthalidone, lisinopril, and metoprolol.  I did advise that she check her blood pressure daily over the next 2 weeks and contact us if systolics are consistently greater than 130, at which time, I would recommend addition of amlodipine 2.5 mg daily.  Otherwise follow-up in 1 year.  2.  Mixed hyperlipidemia: On simvastatin and tolerating well.  Follow-up lipids with direct LDL and LFTs at this morning as this has not been checked in some time.  3.  Tobacco abuse: Currently smoking 2 cigarettes a week and otherwise use nicotine gum.  I encouraged her to quit completely.  She hopes to put down cigarettes forever in 2020.  4.  Disposition: Follow-up complete metabolic panel, lipids, direct LDL today.  Patient will contact us if pressures trend greater than 130.  Otherwise, she will plan to follow-up with us in 1 year.   Nicolasa Duckinghristopher Azarion Hove, NP 08/30/2018, 10:49 AM

## 2018-08-31 LAB — LIPID PANEL
Chol/HDL Ratio: 3.1 ratio (ref 0.0–4.4)
Cholesterol, Total: 150 mg/dL (ref 100–199)
HDL: 48 mg/dL (ref >39)
LDL Calculated: 85 mg/dL (ref 0–99)
Triglycerides: 87 mg/dL (ref 0–149)
VLDL Cholesterol Cal: 17 mg/dL (ref 5–40)

## 2018-08-31 LAB — COMPREHENSIVE METABOLIC PANEL
A/G RATIO: 1.4 (ref 1.2–2.2)
ALT: 10 IU/L (ref 0–32)
AST: 20 IU/L (ref 0–40)
Albumin: 4 g/dL (ref 3.5–4.8)
Alkaline Phosphatase: 99 IU/L (ref 39–117)
BUN/Creatinine Ratio: 14 (ref 12–28)
BUN: 14 mg/dL (ref 8–27)
Bilirubin Total: 0.4 mg/dL (ref 0.0–1.2)
CALCIUM: 9.6 mg/dL (ref 8.7–10.3)
CO2: 24 mmol/L (ref 20–29)
Chloride: 101 mmol/L (ref 96–106)
Creatinine, Ser: 0.97 mg/dL (ref 0.57–1.00)
GFR calc Af Amer: 68 mL/min/{1.73_m2} (ref >59)
GFR, EST NON AFRICAN AMERICAN: 59 mL/min/{1.73_m2} — AB (ref >59)
Globulin, Total: 2.8 g/dL (ref 1.5–4.5)
Glucose: 89 mg/dL (ref 65–99)
Potassium: 4.7 mmol/L (ref 3.5–5.2)
Sodium: 140 mmol/L (ref 134–144)
Total Protein: 6.8 g/dL (ref 6.0–8.5)

## 2018-08-31 LAB — LDL CHOLESTEROL, DIRECT: LDL Direct: 88 mg/dL (ref 0–99)

## 2018-12-06 ENCOUNTER — Telehealth: Payer: Self-pay | Admitting: Nurse Practitioner

## 2018-12-06 NOTE — Telephone Encounter (Signed)
Please call patient, she is asking if it Is ok if she visits her grandkids, with the COVID-19 and her heart condition.

## 2018-12-06 NOTE — Telephone Encounter (Signed)
Call from patient in regards to current virus precautions. She is asking about current suggestions for visiting her grandkids this weekend.   I told patient we could not give specific advice but suggested using precautions as indicated by [place] governor for social distancing of 6 feet and avoiding those who are sick with fever, cough, URI sx and/or dx with COVID 19 virus. If she does need to go into public places I suggested that she wear surgical mask for protection, as well as, avoiding touching face before washing hands. Wash hands for at least 20 seconds with soap and warm water.   Pt verbalized understanding and had no further questions at this time.  Advised pt to call for any further questions or concerns.

## 2019-09-25 ENCOUNTER — Telehealth: Payer: Self-pay | Admitting: Cardiovascular Disease

## 2019-09-25 NOTE — Telephone Encounter (Signed)

## 2019-10-08 ENCOUNTER — Telehealth: Payer: Medicare Other | Admitting: Cardiovascular Disease

## 2019-10-10 ENCOUNTER — Encounter: Payer: Self-pay | Admitting: Cardiovascular Disease

## 2019-10-10 ENCOUNTER — Telehealth (INDEPENDENT_AMBULATORY_CARE_PROVIDER_SITE_OTHER): Payer: Medicare Other | Admitting: Cardiovascular Disease

## 2019-10-10 ENCOUNTER — Other Ambulatory Visit: Payer: Self-pay

## 2019-10-10 VITALS — BP 122/48 | HR 65 | Ht 65.0 in | Wt 136.0 lb

## 2019-10-10 DIAGNOSIS — E785 Hyperlipidemia, unspecified: Secondary | ICD-10-CM | POA: Diagnosis not present

## 2019-10-10 DIAGNOSIS — I10 Essential (primary) hypertension: Secondary | ICD-10-CM

## 2019-10-10 DIAGNOSIS — Z720 Tobacco use: Secondary | ICD-10-CM | POA: Diagnosis not present

## 2019-10-10 MED ORDER — METOPROLOL TARTRATE 25 MG PO TABS: 25.0000 mg | ORAL_TABLET | Freq: Two times a day (BID) | ORAL | 3 refills | Status: DC

## 2019-10-10 MED ORDER — CHLORTHALIDONE 25 MG PO TABS: 25.0000 mg | ORAL_TABLET | Freq: Every day | ORAL | 3 refills | Status: DC

## 2019-10-10 MED ORDER — SIMVASTATIN 20 MG PO TABS: 20.0000 mg | ORAL_TABLET | Freq: Every day | ORAL | 3 refills | Status: DC

## 2019-10-10 MED ORDER — LISINOPRIL 40 MG PO TABS: 40.0000 mg | ORAL_TABLET | Freq: Every day | ORAL | 3 refills | Status: DC

## 2019-10-10 NOTE — Patient Instructions (Signed)
Medication Instructions:  Your physician recommends that you continue on your current medications as directed. Please refer to the Current Medication list given to you today.  Your cardiac medications have been refilled today.  *If you need a refill on your cardiac medications before your next appointment, please call your pharmacy*  Lab Work: Your physician recommends that you return for a FASTING lipid profile, cbc, hepatic panel.  Please have your labs drawn at the [facility] medical mall. You do not need an appointment. Their hours are Mon-Fri 7am-6pm.   If you have labs (blood work) drawn today and your tests are completely normal, you will receive your results only by: Marland Kitchen MyChart Message (if you have MyChart) OR . A paper copy in the mail If you have any lab test that is abnormal or we need to change your treatment, we will call you to review the results.  Testing/Procedures: None ordered  Follow-Up: At [facility], you and your health needs are our priority.  As part of our continuing mission to provide you with exceptional heart care, we have created designated Provider Care Teams.  These Care Teams include your primary Cardiologist (physician) and Advanced Practice Providers (APPs -  Physician Assistants and Nurse Practitioners) who all work together to provide you with the care you need, when you need it.  Your next appointment:   12 month(s)  The format for your next appointment:   In Person  Provider:    You may see Lorine Bears, MD or one of the following Advanced Practice Providers on your designated Care Team:    Nicolasa Ducking, NP  Eula Listen, PA-C  Marisue Ivan, PA-C   Other Instructions N/A

## 2019-10-10 NOTE — Progress Notes (Signed)
Virtual Visit via Telephone Note   This visit type was conducted due to national recommendations for restrictions regarding the COVID-19 Pandemic (e.g. social distancing) in an effort to limit this patient's exposure and mitigate transmission in our community.  Due to her co-morbid illnesses, this patient is at least at moderate risk for complications without adequate follow up.  This format is felt to be most appropriate for this patient at this time.  The patient did not have access to video technology/had technical difficulties with video requiring transitioning to audio format only (telephone).  All issues noted in this document were discussed and addressed.  No physical exam could be performed with this format.  Please refer to the patient's chart for her  consent to telehealth for [facility].   Date:  10/10/2019   ID:  Mary Reeves, DOB [birth year], MRN [id number]  Patient Location: Home Provider Location: Office  PCP:  Emogene Morgan, MD  Cardiologist:  Lorine Bears, MD  Electrophysiologist:  None   Evaluation Performed:  Follow-Up Visit  Chief Complaint: Doing well with no complaints.  History of Present Illness:    Mary M Cuartas is a 74 y.o. female with was reached via phone visit for a follow-up regarding essential hypertension, hyperlipidemia and tobacco use. regarding chest pain and hypertension.  She had previous cardiac catheterization done in 2011 which was personally reviewed by me today.  It showed tortuous coronary arteries with no significant coronary artery disease, normal ejection fraction and no renal artery stenosis.  She is known to have refractory hypertension with no evidence of secondary hypertension. She has been doing very well with no recent chest pain, shortness of breath or palpitations.  She walks daily for exercise and takes her medications regularly.  She almost quit smoking completely but gained 5 pounds.    The patient does not have  symptoms concerning for COVID-19 infection (fever, chills, cough, or new shortness of breath).    Past Medical History:  Diagnosis Date  . Coronary artery disease, non-occlusive    a. 2009 Cath: reportedly nonobstructive; b. 07/2015 treadmill stress test without evidence of ischemia, exercised 2.5 minutes  . Hyperlipidemia   . Hypertension    a. 2016 renal artery ultrasound negative for RAS  . Kidney stones    Past Surgical History:  Procedure Laterality Date  . CARDIAC CATHETERIZATION     [facility]  . MULTIPLE TOOTH EXTRACTIONS       Current Meds  Medication Sig  . aspirin 81 MG chewable tablet Chew 81 mg by mouth daily.  . chlorthalidone (HYGROTON) 25 MG tablet Take 1 tablet (25 mg total) by mouth daily.  Marland Kitchen lisinopril (PRINIVIL,ZESTRIL) 40 MG tablet Take 1 tablet (40 mg total) by mouth daily.  . metoprolol tartrate (LOPRESSOR) 25 MG tablet Take 1 tablet (25 mg total) by mouth 2 (two) times daily.  . simvastatin (ZOCOR) 20 MG tablet Take 1 tablet (20 mg total) by mouth daily.     Allergies:   Penicillins   Social History   Tobacco Use  . Smoking status: Light Tobacco Smoker    Years: 50.00    Types: Cigarettes  . Smokeless tobacco: Never Used  Substance Use Topics  . Alcohol use: No  . Drug use: No     Family Hx: The patient's Family history is unknown by patient.  ROS:   Please see the history of present illness.     All other systems reviewed and are negative.   Prior CV  studies:   The following studies were reviewed today:  I personally reviewed cardiac catheterization images from 2011.  Labs/Other Tests and Data Reviewed:    EKG:  No ECG reviewed.  Recent Labs: No results found for requested labs within last 8760 hours.   Recent Lipid Panel Lab Results  Component Value Date/Time   CHOL 150 08/30/2018 10:54 AM   TRIG 87 08/30/2018 10:54 AM   HDL 48 08/30/2018 10:54 AM   CHOLHDL 3.1 08/30/2018 10:54 AM   LDLCALC 85 08/30/2018 10:54 AM   LDLDIRECT  88 08/30/2018 10:54 AM    Wt Readings from Last 3 Encounters:  10/10/19 136 lb (61.7 kg)  08/30/18 129 lb (58.5 kg)  05/03/17 130 lb 8 oz (59.2 kg)     Objective:    Vital Signs:  BP (!) 122/48   Pulse 65   Ht 5\' 5"  (1.651 m)   Wt 136 lb (61.7 kg)   BMI 22.63 kg/m    VITAL SIGNS:  reviewed  ASSESSMENT & PLAN:     1.  Essential hypertension: Blood pressure is well controlled on current medications.  I refilled her medications and requested routine labs.    2.  Hyperlipidemia: Currently on simvastatin.  Most recent lipid profile in December 2019 showed an LDL of 88.  I requested a follow-up lipid and liver profile.  3. Tobacco use: She almost quit smoking completely.   COVID-19 Education: The signs and symptoms of COVID-19 were discussed with the patient and how to seek care for testing (follow up with PCP or arrange E-visit).  The importance of social distancing was discussed today.  Time:   Today, I have spent 8 minutes with the patient with telehealth technology discussing the above problems.     Medication Adjustments/Labs and Tests Ordered: Current medicines are reviewed at length with the patient today.  Concerns regarding medicines are outlined above.   Tests Ordered: No orders of the defined types were placed in this encounter.   Medication Changes: No orders of the defined types were placed in this encounter.   Follow Up:  In Person in 1 year(s)  Signed, Kathlyn Sacramento, MD  10/10/2019 8:32 AM    [place]

## 2019-11-18 ENCOUNTER — Telehealth: Payer: Self-pay | Admitting: Cardiovascular Disease

## 2019-11-18 NOTE — Telephone Encounter (Signed)
Patient thinks she has a sinus infection and wants to know what she can take that is safe due to heart condition.   Patient advised to also call pcp office for advise while waiting on response.  [facility]red to [facility].

## 2019-11-18 NOTE — Telephone Encounter (Signed)
Spoke with the patient. Patient sts that she has place a call to her pcp's office and she has not been able to get through after holding the line for 30 min.  Advised the patient that id she was to take something for sinuses otc she should avoid medications with the "D" pseudoephedrine component. Advised her that if it were a true infection it may require treatment with antibiotics. Patient sts that she is going to have a friend take her to urgent care.  Advised her that I agreed with that plan. She will bring her current medications with her as well.  Patient voiced appreciation for the call back.

## 2020-12-01 ENCOUNTER — Other Ambulatory Visit: Payer: Self-pay | Admitting: Cardiovascular Disease

## 2020-12-01 NOTE — Telephone Encounter (Signed)
LVM for patient to schedule.

## 2020-12-01 NOTE — Telephone Encounter (Signed)
Patient scheduled with Dr Kirke Corin 12/28/20

## 2020-12-01 NOTE — Telephone Encounter (Signed)
Please schedule F/U appointment for refills. Last seen 10/2019. Thank you!

## 2020-12-28 ENCOUNTER — Other Ambulatory Visit: Payer: Self-pay

## 2020-12-28 ENCOUNTER — Ambulatory Visit (INDEPENDENT_AMBULATORY_CARE_PROVIDER_SITE_OTHER): Payer: Medicare Other | Admitting: Cardiovascular Disease

## 2020-12-28 ENCOUNTER — Encounter: Payer: Self-pay | Admitting: Cardiovascular Disease

## 2020-12-28 VITALS — BP 138/60 | HR 60 | Ht 65.0 in | Wt 126.5 lb

## 2020-12-28 DIAGNOSIS — Z720 Tobacco use: Secondary | ICD-10-CM | POA: Diagnosis not present

## 2020-12-28 DIAGNOSIS — I10 Essential (primary) hypertension: Secondary | ICD-10-CM

## 2020-12-28 DIAGNOSIS — E785 Hyperlipidemia, unspecified: Secondary | ICD-10-CM

## 2020-12-28 NOTE — Progress Notes (Signed)
Cardiology Office Note   Date:  12/28/2020   ID:  Mary Reeves, DOB [birth year], MRN [id number]  PCP:  Emogene Morgan, MD  Cardiologist:   Lorine Bears, MD   Chief Complaint  Patient presents with  . Follow-up    OD F/u LS 08/2018 no complaints today. Meds reviewed verbally with pt.      History of Present Illness: Mary Reeves is a 75 y.o. female who presents for a follow-up visit regarding essential hypertension, hyperlipidemia and tobacco use. She had previous cardiac catheterization done in 2011 which  showed tortuous coronary arteries with no significant coronary artery disease, normal ejection fraction and no renal artery stenosis.  She is known to have refractory hypertension with no evidence of secondary hypertension. She feels great overall and she is very active.  She walks daily for exercise with no symptoms.  She feels that she has a lot of energy.  No chest pain, shortness of breath or palpitations.    Past Medical History:  Diagnosis Date  . Coronary artery disease, non-occlusive    a. 2009 Cath: reportedly nonobstructive; b. 07/2015 treadmill stress test without evidence of ischemia, exercised 2.5 minutes  . Hyperlipidemia   . Hypertension    a. 2016 renal artery ultrasound negative for RAS  . Kidney stones     Past Surgical History:  Procedure Laterality Date  . CARDIAC CATHETERIZATION     [facility]  . MULTIPLE TOOTH EXTRACTIONS       Current Outpatient Medications  Medication Sig Dispense Refill  . aspirin 81 MG chewable tablet Chew 81 mg by mouth daily.    . chlorthalidone (HYGROTON) 25 MG tablet TAKE 1 TABLET BY MOUTH DAILY 90 tablet 0  . lisinopril (ZESTRIL) 40 MG tablet TAKE 1 TABLET BY MOUTH DAILY 90 tablet 0  . loratadine (CLARITIN) 10 MG tablet Take 10 mg by mouth daily.    . metoprolol tartrate (LOPRESSOR) 25 MG tablet TAKE 1 TABLET BY MOUTH TWICE A DAY 180 tablet 0  . simvastatin (ZOCOR) 20 MG tablet TAKE 1 TABLET BY MOUTH DAILY 90  tablet 0   No current facility-administered medications for this visit.    Allergies:   Penicillins    Social History:  The patient  reports that she has been smoking cigarettes. She has smoked for the past 50.00 years. She has never used smokeless tobacco. She reports that she does not drink alcohol and does not use drugs.   Family History:  The patient's Family history is unknown by patient.    ROS:  Please see the history of present illness.   Otherwise, review of systems are positive for none.   All other systems are reviewed and negative.    PHYSICAL EXAM: VS:  BP 138/60 (BP Location: Left Arm, Patient Position: Sitting, Cuff Size: Normal)   Pulse 60   Ht 5\' 5"  (1.651 m)   Wt 126 lb 8 oz (57.4 kg)   SpO2 98%   BMI 21.05 kg/m  , BMI Body mass index is 21.05 kg/m. GEN: Well nourished, well developed, in no acute distress  HEENT: normal  Neck: no JVD, carotid bruits, or masses Cardiac: RRR; no murmurs, rubs, or gallops,no edema  Respiratory:  clear to auscultation bilaterally, normal work of breathing GI: soft, nontender, nondistended, + BS MS: no deformity or atrophy  Skin: warm and dry, no rash Neuro:  Strength and sensation are intact Psych: euthymic mood, full affect   EKG:  EKG is ordered  today. The ekg ordered today demonstrates normal sinus rhythm with no significant ST or T wave changes.   Recent Labs: No results found for requested labs within last 8760 hours.    Lipid Panel    Component Value Date/Time   CHOL 150 08/30/2018 1054   TRIG 87 08/30/2018 1054   HDL 48 08/30/2018 1054   CHOLHDL 3.1 08/30/2018 1054   LDLCALC 85 08/30/2018 1054   LDLDIRECT 88 08/30/2018 1054      Wt Readings from Last 3 Encounters:  12/28/20 126 lb 8 oz (57.4 kg)  10/10/19 136 lb (61.7 kg)  08/30/18 129 lb (58.5 kg)        No flowsheet data found.    ASSESSMENT AND PLAN:  1.  Essential hypertension: Blood pressure is well controlled on current medications.   I refilled her medications and requested routine labs.    2.  Hyperlipidemia: Currently on simvastatin.  Most recent lipid profile in December 2019 showed an LDL of 88.  I requested a follow-up lipid and liver profile.  3. Tobacco use: She has not been able to quit completely but she is close.  She is determined to quit and I discussed this with her.    Disposition:   FU with me in 1 year  Signed,  Lorine Bears, MD  12/28/2020 3:50 PM    [facility]

## 2020-12-28 NOTE — Patient Instructions (Signed)
Medication Instructions:  Your physician recommends that you continue on your current medications as directed. Please refer to the Current Medication list given to you today.  *If you need a refill on your cardiac medications before your next appointment, please call your pharmacy*   Lab Work: Cmp, Cbc, Lipid today If you have labs (blood work) drawn today and your tests are completely normal, you will receive your results only by: Marland Kitchen MyChart Message (if you have MyChart) OR . A paper copy in the mail If you have any lab test that is abnormal or we need to change your treatment, we will call you to review the results.   Testing/Procedures: None ordered   Follow-Up: At [facility], you and your health needs are our priority.  As part of our continuing mission to provide you with exceptional heart care, we have created designated Provider Care Teams.  These Care Teams include your primary Cardiologist (physician) and Advanced Practice Providers (APPs -  Physician Assistants and Nurse Practitioners) who all work together to provide you with the care you need, when you need it.  We recommend signing up for the patient portal called "MyChart".  Sign up information is provided on this After Visit Summary.  MyChart is used to connect with patients for Virtual Visits (Telemedicine).  Patients are able to view lab/test results, encounter notes, upcoming appointments, etc.  Non-urgent messages can be sent to your provider as well.   To learn more about what you can do with MyChart, go to [email or web address].    Your next appointment:   Your physician wants you to follow-up in: 12 months You will receive a reminder letter in the mail two months in advance. If you don't receive a letter, please call our office to schedule the follow-up appointment.   The format for your next appointment:   In Person  Provider:   You may see Lorine Bears, MD or one of the following Advanced Practice  Providers on your designated Care Team:    Nicolasa Ducking, NP  Eula Listen, PA-C  Marisue Ivan, PA-C  Cadence Pomona, [place]  Gillian Shields, NP    Other Instructions N/A

## 2020-12-29 ENCOUNTER — Telehealth: Payer: Self-pay | Admitting: Cardiovascular Disease

## 2020-12-29 NOTE — Telephone Encounter (Signed)
[facility] calling to let office know they will be faxing a new report involving the CBC results. The tube was misplaced but has been found

## 2020-12-29 NOTE — Telephone Encounter (Signed)
Noted  

## 2020-12-30 LAB — COMPREHENSIVE METABOLIC PANEL
ALT: 8 IU/L (ref 0–32)
AST: 15 IU/L (ref 0–40)
Albumin/Globulin Ratio: 1.2 (ref 1.2–2.2)
Albumin: 4.1 g/dL (ref 3.7–4.7)
Alkaline Phosphatase: 88 IU/L (ref 44–121)
BUN/Creatinine Ratio: 18 (ref 12–28)
BUN: 20 mg/dL (ref 8–27)
Bilirubin Total: 0.4 mg/dL (ref 0.0–1.2)
CO2: 23 mmol/L (ref 20–29)
Calcium: 9.9 mg/dL (ref 8.7–10.3)
Chloride: 102 mmol/L (ref 96–106)
Creatinine, Ser: 1.1 mg/dL — ABNORMAL HIGH (ref 0.57–1.00)
Globulin, Total: 3.3 g/dL (ref 1.5–4.5)
Glucose: 86 mg/dL (ref 65–99)
Potassium: 4.3 mmol/L (ref 3.5–5.2)
Sodium: 140 mmol/L (ref 134–144)
Total Protein: 7.4 g/dL (ref 6.0–8.5)
eGFR: 53 mL/min/{1.73_m2} — ABNORMAL LOW (ref >59)

## 2020-12-30 LAB — CBC WITH DIFFERENTIAL/PLATELET
Basophils Absolute: 0.1 10*3/uL (ref 0.0–0.2)
Basos: 1 %
EOS (ABSOLUTE): 0.1 10*3/uL (ref 0.0–0.4)
Eos: 1 %
Hematocrit: 38.5 % (ref 34.0–46.6)
Hemoglobin: 11.7 g/dL (ref 11.1–15.9)
Immature Grans (Abs): 0 10*3/uL (ref 0.0–0.1)
Immature Granulocytes: 1 %
Lymphocytes Absolute: 3.8 10*3/uL — ABNORMAL HIGH (ref 0.7–3.1)
Lymphs: 51 %
MCH: 24.1 pg — ABNORMAL LOW (ref 26.6–33.0)
MCHC: 30.4 g/dL — ABNORMAL LOW (ref 31.5–35.7)
MCV: 79 fL (ref 79–97)
Monocytes Absolute: 0.5 10*3/uL (ref 0.1–0.9)
Monocytes: 7 %
Neutrophils Absolute: 2.9 10*3/uL (ref 1.4–7.0)
Neutrophils: 39 %
Platelets: 392 10*3/uL (ref 150–450)
RBC: 4.85 x10E6/uL (ref 3.77–5.28)
RDW: 15 % (ref 11.7–15.4)
WBC: 7.4 10*3/uL (ref 3.4–10.8)

## 2020-12-30 LAB — LIPID PANEL
Chol/HDL Ratio: 3.3 ratio (ref 0.0–4.4)
Cholesterol, Total: 156 mg/dL (ref 100–199)
HDL: 47 mg/dL (ref >39)
LDL Chol Calc (NIH): 90 mg/dL (ref 0–99)
Triglycerides: 102 mg/dL (ref 0–149)
VLDL Cholesterol Cal: 19 mg/dL (ref 5–40)

## 2021-04-01 ENCOUNTER — Other Ambulatory Visit: Payer: Self-pay | Admitting: Cardiovascular Disease

## 2021-04-01 ENCOUNTER — Telehealth: Payer: Self-pay | Admitting: Cardiovascular Disease

## 2021-04-01 NOTE — Telephone Encounter (Signed)
All medication have been sent to pharmacy preference for a 90 day supply.

## 2021-04-01 NOTE — Telephone Encounter (Signed)
*  STAT* If patient is at the pharmacy, call can be transferred to refill team.   1. Which medications need to be refilled? (please list name of each medication and dose if known)  Lisinopril 40 MG 1 tablet daily  Metoprolol tartrate 25 MG 1 tablet twice daily Simvastatin 20 MG 1 tablet daily Chlorthalidone 25 MG 1 tablet daily  2. Which pharmacy/location (including street and city if local pharmacy) is medication to be sent to? [facility]   3. Do they need a 30 day or 90 day supply? 90 day

## 2021-06-13 ENCOUNTER — Other Ambulatory Visit: Payer: Self-pay | Admitting: Cardiovascular Disease

## 2021-10-19 NOTE — Congregational Nurse Program (Signed)
°  Dept: [phone]   Congregational Nurse Program Note  Date of Encounter: 10/19/2021  Resident came to clinic for blood pressure check, BP 118/60, pulse 58, O2 sat 97%, weight 124 lbs  Discussed blood pressure medications and possibility of a senior lunch program at [employer] .  Past Medical History: Past Medical History:  Diagnosis Date   Coronary artery disease, non-occlusive    a. 2009 Cath: reportedly nonobstructive; b. 07/2015 treadmill stress test without evidence of ischemia, exercised 2.5 minutes   Hyperlipidemia    Hypertension    a. 2016 renal artery ultrasound negative for RAS   Kidney stones     Encounter Details:  CNP Questionnaire - 10/19/21 1334       Questionnaire   Do you give verbal consent to treat you today? Yes    Location Patient Served  [facility]    Visit Setting Church or Organization    Patient Status Unknown   Has own apt at [employer];Medicare    Insurance Referral N/A    Medication N/A    Medical Provider Yes    Screening Referrals N/A    Medical Referral N/A    Medical Appointment Made N/A    Food N/A    Transportation N/A    Housing/Utilities N/A    Interpersonal Safety N/A    Intervention Blood pressure;Support;Educate    ED Visit Averted N/A    Life-Saving Intervention Made N/A

## 2021-10-26 NOTE — Congregational Nurse Program (Signed)
°  Dept: [phone]   Congregational Nurse Program Note  Date of Encounter: 10/26/2021   Resident came to clinic for blood pressure checks, has no complaints of any discomfort.  BP 112/70, pulse 62 and slightly irregular, O2 Sat 99%.  Discussed blood pressure medications and diet to manage heart disease. Past Medical History: Past Medical History:  Diagnosis Date   Coronary artery disease, non-occlusive    a. 2009 Cath: reportedly nonobstructive; b. 07/2015 treadmill stress test without evidence of ischemia, exercised 2.5 minutes   Hyperlipidemia    Hypertension    a. 2016 renal artery ultrasound negative for RAS   Kidney stones     Encounter Details:

## 2021-11-30 NOTE — Congregational Nurse Program (Signed)
?  Dept: [phone] ? ? ?Congregational Nurse Program Note ? ?Date of Encounter: 11/30/2021 ? ? ? ?Dept: [phone] ? ? ?Congregational Nurse Program Note ? ?Date of Encounter: 11/30/2021 ? ?Clinic visit for blood pressure check, BP 144/70, pulse 70, O2 sat 99%, weight 126 lbs.  Discussed elevated systolic and she stated just smoked a cigarette before coming to nurse clinic.  States she is trying to quit and is considering asking MD for prescription.  Discussed benefits of quitting and types of medications that could be used and method of cutting back on number of cigarettes each day.  ? ?Past Medical History: ?Past Medical History:  ?Diagnosis Date  ? Coronary artery disease, non-occlusive   ? a. 2009 Cath: reportedly nonobstructive; b. 07/2015 treadmill stress test without evidence of ischemia, exercised 2.5 minutes  ? Hyperlipidemia   ? Hypertension   ? a. 2016 renal artery ultrasound negative for RAS  ? Kidney stones   ? ? ?Encounter Details: ? ? ? ?Past Medical History: ?Past Medical History:  ?Diagnosis Date  ? Coronary artery disease, non-occlusive   ? a. 2009 Cath: reportedly nonobstructive; b. 07/2015 treadmill stress test without evidence of ischemia, exercised 2.5 minutes  ? Hyperlipidemia   ? Hypertension   ? a. 2016 renal artery ultrasound negative for RAS  ? Kidney stones   ? ? ?Encounter Details: ? ? ? ? ?

## 2021-12-21 NOTE — Congregational Nurse Program (Signed)
?  Dept: [phone] ? ? ?Congregational Nurse Program Note ? ?Date of Encounter: 12/21/2021 ? ? ?Clinic visit for blood pressure check, BP 132/68, pulse 87 and slightly irregular.  Resident had come from doing laundry and complained of warmer temperature walking outside today.  Educated on blood pressure levels of hypertension and importance of taking medications as prescribed. ?Past Medical History: ?Past Medical History:  ?Diagnosis Date  ? Coronary artery disease, non-occlusive   ? a. 2009 Cath: reportedly nonobstructive; b. 07/2015 treadmill stress test without evidence of ischemia, exercised 2.5 minutes  ? Hyperlipidemia   ? Hypertension   ? a. 2016 renal artery ultrasound negative for RAS  ? Kidney stones   ? ? ?Encounter Details: ? CNP Questionnaire - 12/21/21 1225   ? ?  ? Questionnaire  ? Do you give verbal consent to treat you today? Yes   ? Location Patient Served  [facility]   ? Visit Setting Church or Organization   ? Patient Status Unknown   ? Insurance Medicaid;Medicare   ? Insurance Referral N/A   ? Medication N/A   ? Medical Provider Yes   ? Screening Referrals N/A   ? Medical Referral N/A   ? Medical Appointment Made N/A   ? Food N/A   ? Transportation N/A   ? Housing/Utilities N/A   ? Interpersonal Safety N/A   ? Intervention Blood pressure;Support;Educate   ? ED Visit Averted N/A   ? Life-Saving Intervention Made N/A   ? ?  ?  ? ?  ? ? ? ? ?

## 2021-12-29 ENCOUNTER — Ambulatory Visit: Payer: Medicare Other | Admitting: Cardiovascular Disease

## 2021-12-29 NOTE — Progress Notes (Deleted)
Cardiology Office Note   Date:  12/29/2021   ID:  Mary Reeves, DOB Apr 19, 1946, MRN [id number]  PCP:  Emogene Morgan, MD  Cardiologist:   Lorine Bears, MD   No chief complaint on file.     History of Present Illness: Mary Reeves is a 76 y.o. female who presents for a follow-up visit regarding essential hypertension, hyperlipidemia and tobacco use. She had previous cardiac catheterization done in 2011 which  showed tortuous coronary arteries with no significant coronary artery disease, normal ejection fraction and no renal artery stenosis.  She is known to have refractory hypertension with no evidence of secondary hypertension. She feels great overall and she is very active.  She walks daily for exercise with no symptoms.  She feels that she has a lot of energy.  No chest pain, shortness of breath or palpitations.    Past Medical History:  Diagnosis Date   Coronary artery disease, non-occlusive    a. 2009 Cath: reportedly nonobstructive; b. 07/2015 treadmill stress test without evidence of ischemia, exercised 2.5 minutes   Hyperlipidemia    Hypertension    a. 2016 renal artery ultrasound negative for RAS   Kidney stones     Past Surgical History:  Procedure Laterality Date   CARDIAC CATHETERIZATION     [facility]   MULTIPLE TOOTH EXTRACTIONS       Current Outpatient Medications  Medication Sig Dispense Refill   aspirin 81 MG chewable tablet Chew 81 mg by mouth daily.     chlorthalidone (HYGROTON) 25 MG tablet TAKE 1 TABLET BY MOUTH DAILY 90 tablet 2   lisinopril (ZESTRIL) 40 MG tablet TAKE 1 TABLET BY MOUTH DAILY 90 tablet 2   loratadine (CLARITIN) 10 MG tablet Take 10 mg by mouth daily.     metoprolol tartrate (LOPRESSOR) 25 MG tablet TAKE 1 TABLET BY MOUTH TWICE A DAY 180 tablet 2   simvastatin (ZOCOR) 20 MG tablet TAKE 1 TABLET BY MOUTH DAILY 90 tablet 2   No current facility-administered medications for this visit.    Allergies:   Penicillins     Social History:  The patient  reports that she has been smoking cigarettes. She has never used smokeless tobacco. She reports that she does not drink alcohol and does not use drugs.   Family History:  The patient's Family history is unknown by patient.    ROS:  Please see the history of present illness.   Otherwise, review of systems are positive for none.   All other systems are reviewed and negative.    PHYSICAL EXAM: VS:  There were no vitals taken for this visit. , BMI There is no height or weight on file to calculate BMI. GEN: Well nourished, well developed, in no acute distress  HEENT: normal  Neck: no JVD, carotid bruits, or masses Cardiac: RRR; no murmurs, rubs, or gallops,no edema  Respiratory:  clear to auscultation bilaterally, normal work of breathing GI: soft, nontender, nondistended, + BS MS: no deformity or atrophy  Skin: warm and dry, no rash Neuro:  Strength and sensation are intact Psych: euthymic mood, full affect   EKG:  EKG is ordered today. The ekg ordered today demonstrates normal sinus rhythm with no significant ST or T wave changes.   Recent Labs: No results found for requested labs within last 8760 hours.    Lipid Panel    Component Value Date/Time   CHOL 156 12/28/2020 1608   TRIG 102 12/28/2020 1608   HDL  47 12/28/2020 1608   CHOLHDL 3.3 12/28/2020 1608   LDLCALC 90 12/28/2020 1608   LDLDIRECT 88 08/30/2018 1054      Wt Readings from Last 3 Encounters:  12/21/21 130 lb (59 kg)  11/30/21 126 lb (57.2 kg)  10/19/21 124 lb (56.2 kg)            View : No data to display.            ASSESSMENT AND PLAN:  1.  Essential hypertension: Blood pressure is well controlled on current medications.  I refilled her medications and requested routine labs.    2.  Hyperlipidemia: Currently on simvastatin.  Most recent lipid profile in December 2019 showed an LDL of 88.  I requested a follow-up lipid and liver profile.  3. Tobacco use:  She has not been able to quit completely but she is close.  She is determined to quit and I discussed this with her.    Disposition:   FU with me in 1 year  Signed,  Lorine Bears, MD  12/29/2021 8:31 AM     [facility]

## 2021-12-30 ENCOUNTER — Encounter: Payer: Self-pay | Admitting: Cardiovascular Disease

## 2022-01-11 NOTE — Congregational Nurse Program (Signed)
?  Dept: [phone] ? ? ?Congregational Nurse Program Note ? ?Date of Encounter: 01/11/2022 ? ? ?Clinic visit for blood pressure check, BP 138/70, Pulse 86, O2 Sat 98%.  Weight stable at 130 Lbs. Discussed benefits of quitting smoking to decrease blood pressure and improve functioning of heart.  States that she is considering using nicotine gum or patches and will discuss with PCP at next visit.   ?Past Medical History: ?Past Medical History:  ?Diagnosis Date  ? Coronary artery disease, non-occlusive   ? a. 2009 Cath: reportedly nonobstructive; b. 07/2015 treadmill stress test without evidence of ischemia, exercised 2.5 minutes  ? Hyperlipidemia   ? Hypertension   ? a. 2016 renal artery ultrasound negative for RAS  ? Kidney stones   ? ? ?Encounter Details: ? ? ? ? ?

## 2022-01-25 NOTE — Congregational Nurse Program (Signed)
  Dept: [phone]   Congregational Nurse Program Note  Date of Encounter: 01/25/2022  Visit for blood pressure check, BP 110/60, pulse 60 and regular. Weight 130 lbs, O2 Sat 99%.  States she is taking blood pressure medications as ordered by physician.  Discussed blood pressure readings over past months visits.   Past Medical History: Past Medical History:  Diagnosis Date   Coronary artery disease, non-occlusive    a. 2009 Cath: reportedly nonobstructive; b. 07/2015 treadmill stress test without evidence of ischemia, exercised 2.5 minutes   Hyperlipidemia    Hypertension    a. 2016 renal artery ultrasound negative for RAS   Kidney stones     Encounter Details:

## 2022-02-15 NOTE — Congregational Nurse Program (Signed)
  Dept: [place] Nurse Program Note  Date of Encounter: 02/15/2022  Clinic visit for blood pressure and weight check.  BP 122/70, pulse 70 and regular, O2 Sat 99%.  Continues to smoke but does take al blood pressure medications as prescribed per her report.  Weight 126 lbs. Past Medical History: Past Medical History:  Diagnosis Date   Coronary artery disease, non-occlusive    a. 2009 Cath: reportedly nonobstructive; b. 07/2015 treadmill stress test without evidence of ischemia, exercised 2.5 minutes   Hyperlipidemia    Hypertension    a. 2016 renal artery ultrasound negative for RAS   Kidney stones     Encounter Details:  CNP Questionnaire - 02/15/22 1145       Questionnaire   Do you give verbal consent to treat you today? Yes    Location Patient [place] or Organization    Patient Status Unknown    Insurance Medicaid;Medicare    Insurance Referral N/A    Medication N/A    Medical Provider Yes    Screening Referrals N/A    Medical Referral N/A    Medical Appointment Made N/A    Food N/A    Housing/Utilities N/A    Interpersonal Safety N/A    Intervention Blood pressure;Support    ED Visit Averted N/A    Life-Saving Intervention Made N/A

## 2022-03-01 NOTE — Congregational Nurse Program (Signed)
  Dept: [phone]   Congregational Nurse Program Note  Date of Encounter: 03/01/2022  Clinic visit for blood pressure check, BP 130/72, pulse 72 and regular, O2 Sat 99%, weight 130 Lbs.  States she is planning to stop smoking using nicotine gum to help her quit.  Discussed benefits of quitting on cardiovascular system as well as to the lungs.  Explained that the gum has nicotine but the amount is lower than in cigarettes and it cuts the chemicals out that are inhaled when smoking.  Past Medical History: Past Medical History:  Diagnosis Date   Coronary artery disease, non-occlusive    a. 2009 Cath: reportedly nonobstructive; b. 07/2015 treadmill stress test without evidence of ischemia, exercised 2.5 minutes   Hyperlipidemia    Hypertension    a. 2016 renal artery ultrasound negative for RAS   Kidney stones     Encounter Details:  CNP Questionnaire - 03/01/22 1330       Questionnaire   Do you give verbal consent to treat you today? Yes    Location Patient Served  [facility]    Visit Setting [place] or Organization    Patient Status Unknown    Insurance Medicaid;Medicare    Insurance Referral N/A    Medication N/A    Medical Provider Yes    Screening Referrals N/A    Medical Referral N/A    Medical Appointment Made N/A    Food N/A    Transportation N/A    Housing/Utilities N/A    Interpersonal Safety N/A    Intervention Blood pressure;Counsel;Support    ED Visit Averted N/A    Life-Saving Intervention Made N/A

## 2022-04-06 ENCOUNTER — Encounter: Payer: Self-pay | Admitting: Cardiovascular Disease

## 2022-04-06 ENCOUNTER — Ambulatory Visit (INDEPENDENT_AMBULATORY_CARE_PROVIDER_SITE_OTHER): Payer: Medicare Other | Admitting: Cardiovascular Disease

## 2022-04-06 ENCOUNTER — Other Ambulatory Visit
Admission: RE | Admit: 2022-04-06 | Discharge: 2022-04-06 | Disposition: A | Discharge status: Home / Self Care | Payer: Medicare Other | Source: Ambulatory Visit | Attending: Cardiovascular Disease | Admitting: Cardiovascular Disease

## 2022-04-06 VITALS — BP 136/60 | HR 53 | Ht 65.0 in | Wt 130.8 lb

## 2022-04-06 DIAGNOSIS — I10 Essential (primary) hypertension: Secondary | ICD-10-CM | POA: Insufficient documentation

## 2022-04-06 DIAGNOSIS — E785 Hyperlipidemia, unspecified: Secondary | ICD-10-CM | POA: Insufficient documentation

## 2022-04-06 DIAGNOSIS — Z720 Tobacco use: Secondary | ICD-10-CM

## 2022-04-06 LAB — CBC WITH DIFFERENTIAL/PLATELET
Abs Immature Granulocytes: 0.01 10*3/uL (ref 0.00–0.07)
Basophils Absolute: 0 10*3/uL (ref 0.0–0.1)
Basophils Relative: 1 %
Eosinophils Absolute: 0.2 10*3/uL (ref 0.0–0.5)
Eosinophils Relative: 3 %
HCT: 40.2 % (ref 36.0–46.0)
Hemoglobin: 12.7 g/dL (ref 12.0–15.0)
Immature Granulocytes: 0 %
Lymphocytes Relative: 55 %
Lymphs Abs: 3.8 10*3/uL (ref 0.7–4.0)
MCH: 24 pg — ABNORMAL LOW (ref 26.0–34.0)
MCHC: 31.6 g/dL (ref 30.0–36.0)
MCV: 76 fL — ABNORMAL LOW (ref 80.0–100.0)
Monocytes Absolute: 0.5 10*3/uL (ref 0.1–1.0)
Monocytes Relative: 8 %
Neutro Abs: 2.3 10*3/uL (ref 1.7–7.7)
Neutrophils Relative %: 33 %
Platelets: 358 10*3/uL (ref 150–400)
RBC: 5.29 MIL/uL — ABNORMAL HIGH (ref 3.87–5.11)
RDW: 15.6 % — ABNORMAL HIGH (ref 11.5–15.5)
WBC: 6.9 10*3/uL (ref 4.0–10.5)
nRBC: 0 % (ref 0.0–0.2)

## 2022-04-06 LAB — COMPREHENSIVE METABOLIC PANEL
ALT: 14 U/L (ref 0–44)
AST: 20 U/L (ref 15–41)
Albumin: 3.9 g/dL (ref 3.5–5.0)
Alkaline Phosphatase: 84 U/L (ref 38–126)
Anion gap: 6 (ref 5–15)
BUN: 23 mg/dL (ref 8–23)
CO2: 27 mmol/L (ref 22–32)
Calcium: 9.8 mg/dL (ref 8.9–10.3)
Chloride: 105 mmol/L (ref 98–111)
Creatinine, Ser: 1.02 mg/dL — ABNORMAL HIGH (ref 0.44–1.00)
GFR, Estimated: 58 mL/min — ABNORMAL LOW (ref >60)
Glucose, Bld: 88 mg/dL (ref 70–99)
Potassium: 4.1 mmol/L (ref 3.5–5.1)
Sodium: 138 mmol/L (ref 135–145)
Total Bilirubin: 0.7 mg/dL (ref 0.3–1.2)
Total Protein: 7.9 g/dL (ref 6.5–8.1)

## 2022-04-06 LAB — LIPID PANEL
Cholesterol: 151 mg/dL (ref 0–200)
HDL: 46 mg/dL (ref >40)
LDL Cholesterol: 74 mg/dL (ref 0–99)
Total CHOL/HDL Ratio: 3.3 RATIO
Triglycerides: 157 mg/dL — ABNORMAL HIGH (ref <150)
VLDL: 31 mg/dL (ref 0–40)

## 2022-04-06 MED ORDER — ASPIRIN 81 MG PO CHEW: 81.0000 mg | CHEWABLE_TABLET | Freq: Every day | ORAL | 3 refills | Status: DC

## 2022-04-06 MED ORDER — CHLORTHALIDONE 25 MG PO TABS: 25.0000 mg | ORAL_TABLET | Freq: Every day | ORAL | 3 refills | Status: DC

## 2022-04-06 MED ORDER — SIMVASTATIN 20 MG PO TABS: 20.0000 mg | ORAL_TABLET | Freq: Every day | ORAL | 3 refills | Status: DC

## 2022-04-06 MED ORDER — METOPROLOL TARTRATE 25 MG PO TABS: 25.0000 mg | ORAL_TABLET | Freq: Two times a day (BID) | ORAL | 3 refills | Status: DC

## 2022-04-06 MED ORDER — LISINOPRIL 40 MG PO TABS: 40.0000 mg | ORAL_TABLET | Freq: Every day | ORAL | 3 refills | Status: DC

## 2022-04-06 NOTE — Patient Instructions (Signed)
Medication Instructions:  Your physician recommends that you continue on your current medications as directed. Please refer to the Current Medication list given to you today.  *If you need a refill on your cardiac medications before your next appointment, please call your pharmacy*   Lab Work: Cmp, Cbc, Lipid  Please have your labs drawn at the [facility]. Stop at the Registration desk to check in.  If you have labs (blood work) drawn today and your tests are completely normal, you will receive your results only by: MyChart Message (if you have MyChart) OR A paper copy in the mail If you have any lab test that is abnormal or we need to change your treatment, we will call you to review the results.   Testing/Procedures: None ordered   Follow-Up: At [facility], you and your health needs are our priority.  As part of our continuing mission to provide you with exceptional heart care, we have created designated Provider Care Teams.  These Care Teams include your primary Cardiologist (physician) and Advanced Practice Providers (APPs -  Physician Assistants and Nurse Practitioners) who all work together to provide you with the care you need, when you need it.  We recommend signing up for the patient portal called "MyChart".  Sign up information is provided on this After Visit Summary.  MyChart is used to connect with patients for Virtual Visits (Telemedicine).  Patients are able to view lab/test results, encounter notes, upcoming appointments, etc.  Non-urgent messages can be sent to your provider as well.   To learn more about what you can do with MyChart, go to [email or web address].    Your next appointment:   Your physician wants you to follow-up in: 1 year You will receive a reminder letter in the mail two months in advance. If you don't receive a letter, please call our office to schedule the follow-up appointment.   The format for your next appointment:   In  Person  Provider:   You may see Lorine Bears, MD or one of the following Advanced Practice Providers on your designated Care Team:   Nicolasa Ducking, NP Eula Listen, PA-C Cadence Fransico Michael, [place]  Other Instructions N/A  Important Information About Sugar

## 2022-04-06 NOTE — Progress Notes (Signed)
Cardiology Office Note   Date:  04/06/2022   ID:  Mary M Grau, DOB 16-Apr-1946, MRN [id number]  PCP:  Emogene Morgan, MD  Cardiologist:   Lorine Bears, MD   Chief Complaint  Patient presents with   Follow-up    12 month follow up,  no new cardiac concerns       History of Present Illness: Mary Reeves is a 76 y.o. female who presents for a follow-up visit regarding essential hypertension, hyperlipidemia and tobacco use. She had previous cardiac catheterization done in 2011 which  showed tortuous coronary arteries with no significant coronary artery disease, normal ejection fraction and no renal artery stenosis.  She is known to have refractory hypertension with no evidence of secondary hypertension. She has been doing very well with no chest pain, shortness of breath or palpitations.  She is very active and tries to exercise.    Past Medical History:  Diagnosis Date   Coronary artery disease, non-occlusive    a. 2009 Cath: reportedly nonobstructive; b. 07/2015 treadmill stress test without evidence of ischemia, exercised 2.5 minutes   Hyperlipidemia    Hypertension    a. 2016 renal artery ultrasound negative for RAS   Kidney stones     Past Surgical History:  Procedure Laterality Date   CARDIAC CATHETERIZATION     [facility]   MULTIPLE TOOTH EXTRACTIONS       Current Outpatient Medications  Medication Sig Dispense Refill   chlorthalidone (HYGROTON) 25 MG tablet TAKE 1 TABLET BY MOUTH DAILY 90 tablet 2   lisinopril (ZESTRIL) 40 MG tablet TAKE 1 TABLET BY MOUTH DAILY 90 tablet 2   metoprolol tartrate (LOPRESSOR) 25 MG tablet TAKE 1 TABLET BY MOUTH TWICE A DAY 180 tablet 2   simvastatin (ZOCOR) 20 MG tablet TAKE 1 TABLET BY MOUTH DAILY 90 tablet 2   aspirin 81 MG chewable tablet Chew 81 mg by mouth daily. (Patient not taking: Reported on 04/06/2022)     loratadine (CLARITIN) 10 MG tablet Take 10 mg by mouth daily. (Patient not taking: Reported on 04/06/2022)     No  current facility-administered medications for this visit.    Allergies:   Penicillins    Social History:  The patient  reports that she has been smoking cigarettes. She has never used smokeless tobacco. She reports that she does not drink alcohol and does not use drugs.   Family History:  The patient's Family history is unknown by patient.    ROS:  Please see the history of present illness.   Otherwise, review of systems are positive for none.   All other systems are reviewed and negative.    PHYSICAL EXAM: VS:  BP 136/60 (BP Location: Left Arm, Patient Position: Sitting, Cuff Size: Normal)   Pulse (!) 53   Ht 5\' 5"  (1.651 m)   Wt 130 lb 12.8 oz (59.3 kg)   SpO2 98%   BMI 21.77 kg/m  , BMI Body mass index is 21.77 kg/m. GEN: Well nourished, well developed, in no acute distress  HEENT: normal  Neck: no JVD, carotid bruits, or masses Cardiac: RRR; no murmurs, rubs, or gallops,no edema  Respiratory:  clear to auscultation bilaterally, normal work of breathing GI: soft, nontender, nondistended, + BS MS: no deformity or atrophy  Skin: warm and dry, no rash Neuro:  Strength and sensation are intact Psych: euthymic mood, full affect   EKG:  EKG is ordered today. The ekg ordered today demonstrates sinus bradycardia with no  significant ST or T wave changes.  Recent Labs: No results found for requested labs within last 365 days.    Lipid Panel    Component Value Date/Time   CHOL 156 12/28/2020 1608   TRIG 102 12/28/2020 1608   HDL 47 12/28/2020 1608   CHOLHDL 3.3 12/28/2020 1608   LDLCALC 90 12/28/2020 1608   LDLDIRECT 88 08/30/2018 1054      Wt Readings from Last 3 Encounters:  04/06/22 130 lb 12.8 oz (59.3 kg)  03/01/22 130 lb (59 kg)  02/15/22 126 lb (57.2 kg)            No data to display            ASSESSMENT AND PLAN:  1.  Essential hypertension: Blood pressure is well controlled on current medications.  I refilled her medications and requested  routine labs.    2.  Hyperlipidemia: Currently on simvastatin.  I requested follow-up lipid and liver profile.  3. Tobacco use: she cut down on tobacco use a few cigarettes a day.  I discussed with her the importance of smoking cessation.    Disposition:   FU with me in 1 year  Signed,  Lorine Bears, MD  04/06/2022 2:07 PM    [facility]

## 2023-05-21 ENCOUNTER — Other Ambulatory Visit: Payer: Self-pay | Admitting: Cardiovascular Disease

## 2023-05-21 ENCOUNTER — Telehealth: Payer: Self-pay | Admitting: Cardiovascular Disease

## 2023-05-21 MED ORDER — SIMVASTATIN 20 MG PO TABS: 20.0000 mg | ORAL_TABLET | Freq: Every day | ORAL | 0 refills | Status: DC

## 2023-05-21 MED ORDER — ASPIRIN 81 MG PO CHEW: 81.0000 mg | CHEWABLE_TABLET | Freq: Every day | ORAL | 0 refills | Status: AC

## 2023-05-21 MED ORDER — METOPROLOL TARTRATE 25 MG PO TABS: 25.0000 mg | ORAL_TABLET | Freq: Two times a day (BID) | ORAL | 0 refills | Status: DC

## 2023-05-21 MED ORDER — CHLORTHALIDONE 25 MG PO TABS: 25.0000 mg | ORAL_TABLET | Freq: Every day | ORAL | 0 refills | Status: DC

## 2023-05-21 NOTE — Telephone Encounter (Signed)
Please contact patient for a follow up appointment with Dr. Jarold Song. The patient has a recall for 04/07/2023.  Thanks, Mary Reeves

## 2023-05-21 NOTE — Telephone Encounter (Signed)
*  STAT* If patient is at the pharmacy, call can be transferred to refill team.   1. Which medications need to be refilled? (please list name of each medication and dose if known) aspirin 81 MG chewable tablet   chlorthalidone (HYGROTON) 25 MG tablet   lisinopril (ZESTRIL) 40 MG tablet   metoprolol tartrate (LOPRESSOR) 25 MG tablet   simvastatin (ZOCOR) 20 MG tablet   2. Which pharmacy/location (including street and city if local pharmacy) is medication to be sent to?[facility] - [place], [place] - [address]   3. Do they need a 30 day or 90 day supply? 90

## 2023-06-27 ENCOUNTER — Other Ambulatory Visit: Payer: Self-pay | Admitting: Cardiovascular Disease

## 2023-06-28 ENCOUNTER — Telehealth: Payer: Self-pay | Admitting: Cardiovascular Disease

## 2023-06-28 NOTE — Telephone Encounter (Signed)
*  STAT* If patient is at the pharmacy, call can be transferred to refill team.   1. Which medications need to be refilled? (please list name of each medication and dose if known)    chlorthalidone (HYGROTON) 25 MG tablet Take 1 tablet (25 mg total) by mouth daily. Overdue yearly follow up. PLEASE CALL OFFICE TO SCHEDULE APPOINTMENT PRIOR TO NEXT REFILL (second attempt)   lisinopril (ZESTRIL) 40 MG tablet Take 1 tablet (40 mg total) by mouth daily. Overdue yearly follow up. PLEASE CALL OFFICE TO SCHEDULE APPOINTMENT PRIOR TO NEXT REFILL (second attempt)    metoprolol tartrate (LOPRESSOR) 25 MG tablet Take 1 tablet (25 mg total) by mouth 2 (two) times daily. Overdue yearly follow up. PLEASE CALL OFFICE TO SCHEDULE APPOINTMENT PRIOR TO NEXT REFILL (second attempt)   simvastatin (ZOCOR) 20 MG tablet Take 1 tablet (20 mg total) by mouth daily. Overdue yearly follow up. PLEASE CALL OFFICE TO SCHEDULE APPOINTMENT PRIOR TO NEXT REFILL (second attempt)      4. Which pharmacy/location (including street and city if local pharmacy) is medication to be sent to? [facility] - [place], [place] - [address]     5. Do they need a 30 day or 90 day supply? 90   Scheduled for 09/19/22, next available

## 2023-07-23 ENCOUNTER — Other Ambulatory Visit: Payer: Self-pay | Admitting: Cardiovascular Disease

## 2023-09-20 ENCOUNTER — Ambulatory Visit: Payer: 59 | Admitting: Cardiovascular Disease

## 2023-10-16 ENCOUNTER — Ambulatory Visit: Payer: 59 | Admitting: Cardiovascular Disease

## 2023-10-17 NOTE — Progress Notes (Unsigned)
Cardiology Office Note    Date:  10/18/2023   ID:  Mary Reeves, DOB 1946/08/02, MRN [id number]  PCP:  Emogene Morgan, MD  Cardiologist:  Lorine Bears, MD  Electrophysiologist:  None   Chief Complaint: Follow-up  History of Present Illness:   Mary Reeves is a 78 y.o. female with history of HTN, HLD, and tobacco use who presents for follow-up of hypertension and hyperlipidemia.  Remote LHC in 2011 showed torturous coronary arteries with no significant CAD and normal EF.  ETT in 2016 showed poor exercise tolerance with no ischemic changes at peak stress or in recovery.  Renal ultrasound in 2016 showed no evidence of renal artery stenosis.  She was last seen in the office in 04/2022 and was without symptoms of angina or cardiac decompensation.  She comes in doing very well from a cardiac perspective and is without symptoms of angina or cardiac decompensation.  No dyspnea, dizziness, presyncope, or syncope.  No lower extremity swelling or progressive orthopnea.  Remains active at baseline without cardiac limitation.  Walks on a daily basis.  Adherent and tolerating cardiac medications.  Does continue to smoke tobacco, uses a "tar bar."  Does not have any acute cardiac concerns at this time.   Labs independently reviewed: 04/2022 - potassium 4.1, BUN 23, serum creatinine 1.02, albumin 3.9, AST/ALT normal, Hgb 12.7, PLT 358, TC 151, TG 157, HDL 46, LDL 74  Past Medical History:  Diagnosis Date   Coronary artery disease, non-occlusive    a. 2009 Cath: reportedly nonobstructive; b. 07/2015 treadmill stress test without evidence of ischemia, exercised 2.5 minutes   Hyperlipidemia    Hypertension    a. 2016 renal artery ultrasound negative for RAS   Kidney stones     Past Surgical History:  Procedure Laterality Date   CARDIAC CATHETERIZATION     [facility]   MULTIPLE TOOTH EXTRACTIONS      Current Medications: Current Meds  Medication Sig   aspirin 81 MG chewable tablet Chew 1  tablet (81 mg total) by mouth daily.   chlorthalidone (HYGROTON) 25 MG tablet TAKE 1 TABLET BY MOUTH DAILY   lisinopril (ZESTRIL) 40 MG tablet TAKE 1 TABLET BY MOUTH DAILY   loratadine (CLARITIN) 10 MG tablet Take 10 mg by mouth daily.   metoprolol tartrate (LOPRESSOR) 25 MG tablet TAKE 1 TABLET BY MOUTH TWICE A DAY   simvastatin (ZOCOR) 20 MG tablet TAKE 1 TABLET BY MOUTH DAILY    Allergies:   Penicillins   Social History   Socioeconomic History   Marital status: Single    Spouse name: Not on file   Number of children: Not on file   Years of education: Not on file   Highest education level: Not on file  Occupational History   Not on file  Tobacco Use   Smoking status: Light Smoker    Types: Cigarettes   Smokeless tobacco: Never  Substance and Sexual Activity   Alcohol use: No   Drug use: No   Sexual activity: Not on file  Other Topics Concern   Not on file  Social History Narrative   Not on file   Social Drivers of Health   Financial Resource Strain: Not on file  Food Insecurity: Not on file  Transportation Needs: Not on file  Physical Activity: Not on file  Stress: Not on file  Social Connections: Not on file     Family History:  The patient's Family history is unknown by patient.  ROS:   12-point review of systems is negative unless otherwise noted in the HPI.   EKGs/Labs/Other Studies Reviewed:    Studies reviewed were summarized above. The additional studies were reviewed today: As above.   EKG:  EKG is ordered today.  The EKG ordered today demonstrates NSR, 60 bpm, no acute ST-T changes  Recent Labs: No results found for requested labs within last 365 days.  Recent Lipid Panel    Component Value Date/Time   CHOL 151 04/06/2022 1436   CHOL 156 12/28/2020 1608   TRIG 157 (H) 04/06/2022 1436   HDL 46 04/06/2022 1436   HDL 47 12/28/2020 1608   CHOLHDL 3.3 04/06/2022 1436   VLDL 31 04/06/2022 1436   LDLCALC 74 04/06/2022 1436   LDLCALC 90  12/28/2020 1608   LDLDIRECT 88 08/30/2018 1054    PHYSICAL EXAM:    VS:  BP 130/68 (BP Location: Left Arm, Patient Position: Sitting, Cuff Size: Normal)   Pulse 60   Ht 5\' 5"  (1.651 m)   Wt 133 lb 12.8 oz (60.7 kg)   SpO2 98%   BMI 22.27 kg/m   BMI: Body mass index is 22.27 kg/m.  Physical Exam Vitals reviewed.  Constitutional:      Appearance: She is well-developed.  HENT:     Head: Normocephalic and atraumatic.  Eyes:     General:        Right eye: No discharge.        Left eye: No discharge.  Cardiovascular:     Rate and Rhythm: Normal rate and regular rhythm.     Heart sounds: Normal heart sounds, S1 normal and S2 normal. Heart sounds not distant. No midsystolic click and no opening snap. No murmur heard.    No friction rub.  Pulmonary:     Effort: Pulmonary effort is normal. No respiratory distress.     Breath sounds: Normal breath sounds. No decreased breath sounds, wheezing, rhonchi or rales.  Chest:     Chest wall: No tenderness.  Musculoskeletal:     Cervical back: Normal range of motion.  Skin:    General: Skin is warm and dry.     Nails: There is no clubbing.  Neurological:     Mental Status: She is alert and oriented to person, place, and time.  Psychiatric:        Speech: Speech normal.        Behavior: Behavior normal.        Thought Content: Thought content normal.        Judgment: Judgment normal.     Wt Readings from Last 3 Encounters:  10/18/23 133 lb 12.8 oz (60.7 kg)  04/06/22 130 lb 12.8 oz (59.3 kg)  03/01/22 130 lb (59 kg)     ASSESSMENT & PLAN:   HTN: Blood pressure is reasonably controlled in the office today.  Remains on chlorthalidone 25 mg, lisinopril 40 mg, and Lopressor 25 mg twice daily.  Check BMP.  HLD: LDL 74 in 04/2022.  Remains on simvastatin 20 mg.  Check LFT, lipid, and direct LDL.  Tobacco use: Complete cessation is encouraged.    Disposition: F/u with Dr. Kirke Corin or an APP in 12 months.   Medication  Adjustments/Labs and Tests Ordered: Current medicines are reviewed at length with the patient today.  Concerns regarding medicines are outlined above. Medication changes, Labs and Tests ordered today are summarized above and listed in the Patient Instructions accessible in Encounters.   Signed, Eula Listen, PA-C 10/18/2023  2:14 PM     [facility] [address] [place], [place] [address] [phone]

## 2023-10-18 ENCOUNTER — Encounter: Payer: Self-pay | Admitting: Physician Assistant

## 2023-10-18 ENCOUNTER — Ambulatory Visit: Payer: 59 | Attending: Physician Assistant | Admitting: Physician Assistant

## 2023-10-18 VITALS — BP 130/68 | HR 60 | Ht 65.0 in | Wt 133.8 lb

## 2023-10-18 DIAGNOSIS — Z79899 Other long term (current) drug therapy: Secondary | ICD-10-CM

## 2023-10-18 DIAGNOSIS — Z720 Tobacco use: Secondary | ICD-10-CM

## 2023-10-18 DIAGNOSIS — E785 Hyperlipidemia, unspecified: Secondary | ICD-10-CM | POA: Diagnosis not present

## 2023-10-18 DIAGNOSIS — I10 Essential (primary) hypertension: Secondary | ICD-10-CM | POA: Diagnosis not present

## 2023-10-18 NOTE — Patient Instructions (Signed)
Medication Instructions:  Your Physician recommend you continue on your current medication as directed.    *If you need a refill on your cardiac medications before your next appointment, please call your pharmacy*   Lab Work: Your provider would like for you to have following labs drawn today CMeT, CBC, Lipid panel, and Direct LDL.   If you have labs (blood work) drawn today and your tests are completely normal, you will receive your results only by: MyChart Message (if you have MyChart) OR A paper copy in the mail If you have any lab test that is abnormal or we need to change your treatment, we will call you to review the results.   Follow-Up: At [facility], you and your health needs are our priority.  As part of our continuing mission to provide you with exceptional heart care, we have created designated Provider Care Teams.  These Care Teams include your primary Cardiologist (physician) and Advanced Practice Providers (APPs -  Physician Assistants and Nurse Practitioners) who all work together to provide you with the care you need, when you need it.  We recommend signing up for the patient portal called "MyChart".  Sign up information is provided on this After Visit Summary.  MyChart is used to connect with patients for Virtual Visits (Telemedicine).  Patients are able to view lab/test results, encounter notes, upcoming appointments, etc.  Non-urgent messages can be sent to your provider as well.   To learn more about what you can do with MyChart, go to [email or web address].    Your next appointment:   12 month(s)  Provider:   You may see Lorine Bears, MD or one of the following Advanced Practice Providers on your designated Care Team:   Nicolasa Ducking, NP Eula Listen, PA-C Cadence Fransico Michael, PA-C Charlsie Quest, NP Carlos Levering, NP

## 2023-10-19 LAB — CBC
Hematocrit: 41.5 % (ref 34.0–46.6)
Hemoglobin: 13 g/dL (ref 11.1–15.9)
MCH: 24.8 pg — ABNORMAL LOW (ref 26.6–33.0)
MCHC: 31.3 g/dL — ABNORMAL LOW (ref 31.5–35.7)
MCV: 79 fL (ref 79–97)
Platelets: 355 10*3/uL (ref 150–450)
RBC: 5.25 x10E6/uL (ref 3.77–5.28)
RDW: 15.2 % (ref 11.7–15.4)
WBC: 7.8 10*3/uL (ref 3.4–10.8)

## 2023-10-19 LAB — COMPREHENSIVE METABOLIC PANEL
ALT: 13 [IU]/L (ref 0–32)
AST: 18 [IU]/L (ref 0–40)
Albumin: 4.2 g/dL (ref 3.8–4.8)
Alkaline Phosphatase: 108 [IU]/L (ref 44–121)
BUN/Creatinine Ratio: 15 (ref 12–28)
BUN: 15 mg/dL (ref 8–27)
Bilirubin Total: 0.5 mg/dL (ref 0.0–1.2)
CO2: 24 mmol/L (ref 20–29)
Calcium: 10.5 mg/dL — ABNORMAL HIGH (ref 8.7–10.3)
Chloride: 102 mmol/L (ref 96–106)
Creatinine, Ser: 0.99 mg/dL (ref 0.57–1.00)
Globulin, Total: 3.1 g/dL (ref 1.5–4.5)
Glucose: 95 mg/dL (ref 70–99)
Potassium: 5.6 mmol/L — ABNORMAL HIGH (ref 3.5–5.2)
Sodium: 140 mmol/L (ref 134–144)
Total Protein: 7.3 g/dL (ref 6.0–8.5)
eGFR: 59 mL/min/{1.73_m2} — ABNORMAL LOW (ref >59)

## 2023-10-19 LAB — LIPID PANEL
Chol/HDL Ratio: 3.4 {ratio} (ref 0.0–4.4)
Cholesterol, Total: 151 mg/dL (ref 100–199)
HDL: 45 mg/dL (ref >39)
LDL Chol Calc (NIH): 83 mg/dL (ref 0–99)
Triglycerides: 129 mg/dL (ref 0–149)
VLDL Cholesterol Cal: 23 mg/dL (ref 5–40)

## 2023-10-19 LAB — LDL CHOLESTEROL, DIRECT: LDL Direct: 86 mg/dL (ref 0–99)

## 2023-10-24 ENCOUNTER — Other Ambulatory Visit: Payer: Self-pay

## 2023-10-24 DIAGNOSIS — Z79899 Other long term (current) drug therapy: Secondary | ICD-10-CM

## 2023-10-29 ENCOUNTER — Other Ambulatory Visit
Admission: RE | Admit: 2023-10-29 | Discharge: 2023-10-29 | Disposition: A | Discharge status: Home / Self Care | Payer: 59 | Source: Ambulatory Visit | Attending: Physician Assistant | Admitting: Physician Assistant

## 2023-10-29 DIAGNOSIS — Z79899 Other long term (current) drug therapy: Secondary | ICD-10-CM | POA: Diagnosis present

## 2023-10-29 LAB — BASIC METABOLIC PANEL
Anion gap: 10 (ref 5–15)
BUN: 33 mg/dL — ABNORMAL HIGH (ref 8–23)
CO2: 25 mmol/L (ref 22–32)
Calcium: 9.7 mg/dL (ref 8.9–10.3)
Chloride: 104 mmol/L (ref 98–111)
Creatinine, Ser: 1.45 mg/dL — ABNORMAL HIGH (ref 0.44–1.00)
GFR, Estimated: 38 mL/min — ABNORMAL LOW (ref >60)
Glucose, Bld: 106 mg/dL — ABNORMAL HIGH (ref 70–99)
Potassium: 3.5 mmol/L (ref 3.5–5.1)
Sodium: 139 mmol/L (ref 135–145)

## 2023-11-21 NOTE — Congregational Nurse Program (Signed)
  Dept: [phone]   Congregational Nurse Program Note  Date of Encounter: 11/07/2023  Clinic visit to discuss recent cases of influenza and obtain the Spring devotional booklet.  Given items from the food pantry being distributed today at [employer] and the devotional booklet.  Past Medical History: Past Medical History:  Diagnosis Date   Coronary artery disease, non-occlusive    a. 2009 Cath: reportedly nonobstructive; b. 07/2015 treadmill stress test without evidence of ischemia, exercised 2.5 minutes   Hyperlipidemia    Hypertension    a. 2016 renal artery ultrasound negative for RAS   Kidney stones     Encounter Details:  Community Questionnaire - 11/07/23 1030       Questionnaire   Ask client: Do you give verbal consent for me to treat you today? Yes    Student Assistance N/A    Location Patient Served  [facility]    Encounter Setting CN site    Population Status Unknown   Has apartment at [employer];Medicare    Insurance/Financial Assistance Referral N/A    Medication N/A    Medical Provider Yes    Screening Referrals Made N/A    Medical Referrals Made N/A    Medical Appointment Completed N/A    CNP Interventions Advocate/Support;Counsel;Spiritual Care    Screenings CN Performed N/A    ED Visit Averted N/A    Life-Saving Intervention Made N/A

## 2023-12-18 ENCOUNTER — Telehealth: Payer: Self-pay | Admitting: Cardiovascular Disease

## 2023-12-18 NOTE — Telephone Encounter (Signed)
 Pt c/o medication issue:  1. Name of Medication:    2. How are you currently taking this medication (dosage and times per day)?    3. Are you having a reaction (difficulty breathing--STAT)? no  4. What is your medication issue? Calling to see if the dr can prescribe her anything for her allergies, that wouldn't effect her heart medication she is on. Please advise

## 2023-12-18 NOTE — Telephone Encounter (Signed)
*  STAT* If patient is at the pharmacy, call can be transferred to refill team.   1. Which medications need to be refilled? (please list name of each medication and dose if known) chlorthalidone (HYGROTON) 25 MG tablet   lisinopril (ZESTRIL) 40 MG tablet   metoprolol tartrate (LOPRESSOR) 25 MG tablet   simvastatin (ZOCOR) 20 MG tablet   2. Which pharmacy/location (including street and city if local pharmacy) is medication to be sent to? [facility] - [place], [place] - [address]   3. Do they need a 30 day or 90 day supply? 90

## 2023-12-19 ENCOUNTER — Other Ambulatory Visit (HOSPITAL_COMMUNITY): Payer: Self-pay

## 2023-12-19 MED ORDER — CHLORTHALIDONE 25 MG PO TABS
25.0000 mg | ORAL_TABLET | Freq: Every day | ORAL | 3 refills | Status: DC
  Filled 2023-12-19: qty 90, 90d supply, fill #0

## 2023-12-19 MED ORDER — LISINOPRIL 40 MG PO TABS
40.0000 mg | ORAL_TABLET | Freq: Every day | ORAL | 3 refills | Status: DC
  Filled 2023-12-19: qty 90, 90d supply, fill #0

## 2023-12-19 MED ORDER — METOPROLOL TARTRATE 25 MG PO TABS
25.0000 mg | ORAL_TABLET | Freq: Two times a day (BID) | ORAL | 3 refills | Status: DC
  Filled 2023-12-19: qty 180, 90d supply, fill #0

## 2023-12-19 MED ORDER — SIMVASTATIN 20 MG PO TABS
20.0000 mg | ORAL_TABLET | Freq: Every day | ORAL | 3 refills | Status: DC
  Filled 2023-12-19: qty 90, 90d supply, fill #0

## 2023-12-19 NOTE — Telephone Encounter (Signed)
 She can get an allergy medication like allegra, xyzal or Zyrtec over the counter She can also use a saline rinse and flonase nasal spray. These are all OTC. She needs to avoid phenylephrine and pseudoephedrine (D and PE products)

## 2023-12-19 NOTE — Telephone Encounter (Signed)
Called patient, advised of message from [facility].  Patient verbalized understanding.    

## 2023-12-21 ENCOUNTER — Other Ambulatory Visit (HOSPITAL_COMMUNITY): Payer: Self-pay

## 2023-12-21 MED ORDER — CHLORTHALIDONE 25 MG PO TABS
25.0000 mg | ORAL_TABLET | Freq: Every day | ORAL | 3 refills | Status: AC
Start: 2023-12-21 — End: ?

## 2023-12-21 MED ORDER — METOPROLOL TARTRATE 25 MG PO TABS
25.0000 mg | ORAL_TABLET | Freq: Two times a day (BID) | ORAL | 3 refills | Status: AC
Start: 2023-12-21 — End: ?

## 2023-12-21 MED ORDER — LISINOPRIL 40 MG PO TABS: 40.0000 mg | ORAL_TABLET | Freq: Every day | ORAL | 3 refills | Status: AC

## 2023-12-21 MED ORDER — SIMVASTATIN 20 MG PO TABS: 20.0000 mg | ORAL_TABLET | Freq: Every day | ORAL | 3 refills | Status: DC

## 2023-12-21 NOTE — Telephone Encounter (Signed)
  Pt is calling back to follow up. She requested to send it to  [facility] - [place], [place] - [address] However prescription was sent to [facility]. Pt needs it before the weekend

## 2023-12-21 NOTE — Telephone Encounter (Signed)
 Pt's medications were sent to pt's pharmacy as requested. Confirmation received.

## 2023-12-21 NOTE — Addendum Note (Signed)
 Addended by: Gayleen Kawasaki D on: 12/21/2023 04:35 PM   Modules accepted: Orders

## 2024-04-23 ENCOUNTER — Telehealth: Payer: Self-pay | Admitting: Cardiovascular Disease

## 2024-04-23 DIAGNOSIS — R6889 Other general symptoms and signs: Secondary | ICD-10-CM

## 2024-04-23 NOTE — Telephone Encounter (Signed)
 Call returned to the patient. She stated that she is asymptomatic and that the test was part of a health screening protocol. She denies pain when she ambulates and stated that she walks daily without any problem.  An LEA has been ordered and message sent to scheduling.

## 2024-04-23 NOTE — Telephone Encounter (Signed)
 Please check with the patient and see if she has any symptoms of peripheral arterial disease. Schedule LEA and follow-up with me after.

## 2024-04-23 NOTE — Telephone Encounter (Signed)
 Mary Reeves is calling to report PAD screening results. Call transferred.

## 2024-04-23 NOTE — Telephone Encounter (Signed)
 Monica, NP, from [facility] is calling to report PAD score  R : 0.77 mild L : 0.56 moderate  Monica [phone]

## 2024-06-03 ENCOUNTER — Encounter

## 2024-06-23 ENCOUNTER — Other Ambulatory Visit: Payer: Self-pay | Admitting: Cardiovascular Disease

## 2024-06-23 DIAGNOSIS — R6889 Other general symptoms and signs: Secondary | ICD-10-CM

## 2024-06-24 ENCOUNTER — Ambulatory Visit (HOSPITAL_COMMUNITY): Admission: RE | Admit: 2024-06-24 | Discharge: 2024-06-24 | Attending: Surgery | Admitting: Surgery

## 2024-06-24 ENCOUNTER — Ambulatory Visit (HOSPITAL_COMMUNITY)
Admission: RE | Admit: 2024-06-24 | Discharge: 2024-06-24 | Disposition: A | Discharge status: Home / Self Care | Source: Ambulatory Visit | Attending: Cardiovascular Disease | Admitting: Cardiovascular Disease

## 2024-06-24 DIAGNOSIS — R6889 Other general symptoms and signs: Secondary | ICD-10-CM | POA: Insufficient documentation

## 2024-06-24 LAB — VAS US ABI WITH/WO TBI
Left ABI: 0.63
Right ABI: 0.64

## 2024-06-26 ENCOUNTER — Ambulatory Visit: Payer: Self-pay | Admitting: Cardiovascular Disease

## 2024-06-30 ENCOUNTER — Encounter: Payer: Self-pay | Admitting: *Deleted

## 2024-07-08 ENCOUNTER — Ambulatory Visit: Admitting: Cardiovascular Disease

## 2024-07-29 NOTE — Progress Notes (Deleted)
 Cardiology Office Note    Date:  07/29/2024   ID:  Lilia  Kearsten, Ginther 1946-07-27, MRN [id number]  PCP:  Lorel Maxie LABOR, MD  Cardiologist:  Deatrice Cage, MD  Electrophysiologist:  None   Chief Complaint: ***  History of Present Illness:   Mary  VENDETTA Reeves is a 78 y.o. female with history of ***  ***   Labs independently reviewed: 10/2023 - potassium 3.5, BUN 33, serum creatinine 1.45, TC 151, TG 129, HDL 45, LDL 83, direct LDL 86, Hgb 13.0, PLT 355, albumin 4.2, AST/ALT normal  Past Medical History:  Diagnosis Date   Coronary artery disease, non-occlusive    a. 2009 Cath: reportedly nonobstructive; b. 07/2015 treadmill stress test without evidence of ischemia, exercised 2.5 minutes   Hyperlipidemia    Hypertension    a. 2016 renal artery ultrasound negative for RAS   Kidney stones     Past Surgical History:  Procedure Laterality Date   CARDIAC CATHETERIZATION     [facility]   MULTIPLE TOOTH EXTRACTIONS      Current Medications: No outpatient medications have been marked as taking for the 07/30/24 encounter (Appointment) with Abigail Bernardino CHRISTELLA, PA-C.    Allergies:   Penicillins   Social History   Socioeconomic History   Marital status: Single    Spouse name: Not on file   Number of children: Not on file   Years of education: Not on file   Highest education level: Not on file  Occupational History   Not on file  Tobacco Use   Smoking status: Light Smoker    Types: Cigarettes   Smokeless tobacco: Never  Substance and Sexual Activity   Alcohol use: No   Drug use: No   Sexual activity: Not on file  Other Topics Concern   Not on file  Social History Narrative   Not on file   Social Drivers of Health   Financial Resource Strain: Not on file  Food Insecurity: Not on file  Transportation Needs: Not on file  Physical Activity: Not on file  Stress: Not on file  Social Connections: Not on file     Family History:  The patient's Family history is unknown by  patient.  ROS:   12-point review of systems is negative unless otherwise noted in the HPI.   EKGs/Labs/Other Studies Reviewed:    Studies reviewed were summarized above. The additional studies were reviewed today:   ABI 06/24/2024: Summary:  Right: Resting right ankle-brachial index indicates moderate right lower  extremity arterial disease. The right toe-brachial index is abnormal.  Right toe pressure is >60 mmHg which suggests adequate perfusion for  healing.  Left: Resting left ankle-brachial index indicates moderate left lower  extremity arterial disease. The left toe-brachial index is abnormal.  Left toe pressure is >60 mmHg which suggests adequate perfusion for  healing.  __________  Lower extremity arterial ultrasound 06/24/2024: Summary:  Right: 50-74% stenosis noted in the superficial femoral artery.  Left: 50-74% stenosis noted in the superficial femoral artery.     EKG:  EKG is ordered today.  The EKG ordered today demonstrates ***  Recent Labs: 10/18/2023: ALT 13; Hemoglobin 13.0; Platelets 355 10/29/2023: BUN 33; Creatinine, Ser 1.45; Potassium 3.5; Sodium 139  Recent Lipid Panel    Component Value Date/Time   CHOL 151 10/18/2023 1407   TRIG 129 10/18/2023 1407   HDL 45 10/18/2023 1407   CHOLHDL 3.4 10/18/2023 1407   CHOLHDL 3.3 04/06/2022 1436   VLDL 31 04/06/2022 1436  LDLCALC 83 10/18/2023 1407   LDLDIRECT 86 10/18/2023 1407    PHYSICAL EXAM:    VS:  There were no vitals taken for this visit.  BMI: There is no height or weight on file to calculate BMI.  Physical Exam  Wt Readings from Last 3 Encounters:  10/18/23 133 lb 12.8 oz (60.7 kg)  04/06/22 130 lb 12.8 oz (59.3 kg)  03/01/22 130 lb (59 kg)     ASSESSMENT & PLAN:   HTN: Blood pressure  HLD: LDL  Tobacco use:   {Are you ordering a CV Procedure (e.g. stress test, cath, DCCV, TEE, etc)?   Press F2        :[id number]}     Disposition: F/u with Dr. Darron or an APP in  ***.   Medication Adjustments/Labs and Tests Ordered: Current medicines are reviewed at length with the patient today.  Concerns regarding medicines are outlined above. Medication changes, Labs and Tests ordered today are summarized above and listed in the Patient Instructions accessible in Encounters.   Signed, Bernardino Bring, PA-C 07/29/2024 1:32 PM     [facility] [address] [place], [place] [address] [phone]

## 2024-07-30 ENCOUNTER — Ambulatory Visit: Admitting: Physician Assistant

## 2024-07-30 ENCOUNTER — Telehealth: Payer: Self-pay | Admitting: Emergency Medicine

## 2024-07-30 NOTE — Telephone Encounter (Signed)
 As per RD and MA 8/20 note: office visit rescheduled to MA on 12/5

## 2024-08-07 ENCOUNTER — Encounter: Payer: Self-pay | Admitting: *Deleted

## 2024-08-08 ENCOUNTER — Ambulatory Visit: Attending: Cardiovascular Disease | Admitting: Cardiovascular Disease

## 2024-08-08 ENCOUNTER — Encounter: Payer: Self-pay | Admitting: Cardiovascular Disease

## 2024-08-08 VITALS — BP 110/60 | HR 65 | Ht 65.0 in | Wt 126.1 lb

## 2024-08-08 DIAGNOSIS — I739 Peripheral vascular disease, unspecified: Secondary | ICD-10-CM | POA: Diagnosis not present

## 2024-08-08 DIAGNOSIS — R0989 Other specified symptoms and signs involving the circulatory and respiratory systems: Secondary | ICD-10-CM | POA: Diagnosis not present

## 2024-08-08 DIAGNOSIS — E785 Hyperlipidemia, unspecified: Secondary | ICD-10-CM

## 2024-08-08 DIAGNOSIS — I10 Essential (primary) hypertension: Secondary | ICD-10-CM

## 2024-08-08 DIAGNOSIS — Z720 Tobacco use: Secondary | ICD-10-CM

## 2024-08-08 MED ORDER — ROSUVASTATIN CALCIUM 40 MG PO TABS: 40.0000 mg | ORAL_TABLET | Freq: Every day | ORAL | 3 refills | Status: AC

## 2024-08-08 NOTE — Progress Notes (Signed)
 Cardiology Office Note   Date:  08/08/2024   ID:  Mary  VENNESA Reeves, DOB 08/07/1946, MRN [id number]  PCP:  Patient, No Pcp Per  Cardiologist:   Deatrice Cage, MD   Chief Complaint  Patient presents with   Follow-up    6 month f/u. Meds reviewed verbally with pt.      History of Present Illness: Mary  ALVERIA Reeves is a 78 y.o. female who presents for evaluation of recently diagnosed peripheral arterial disease.  She has been followed by me for essential hypertension, hyperlipidemia and tobacco use.   She had previous cardiac catheterization done in 2011 which  showed tortuous coronary arteries with no significant coronary artery disease, normal ejection fraction and no renal artery stenosis.  She is known to have refractory hypertension with no evidence of secondary hypertension.  She had an abnormal screening ABI recently and thus underwent detailed lower extremity arterial Doppler studies in October.  ABI was moderately reduced at 0.64 on the right and 0.63 on the left.  Duplex showed monophasic waveforms in the femoral arteries with evidence of severe stenosis in the mid left SFA.  In spite of peripheral arterial disease, she denies any leg discomfort even with extreme activities.  She remains active and does not report any limitations.  No chest pain or shortness of breath.  She cut down on tobacco use to 1 or 2 cigarettes a day.     Past Medical History:  Diagnosis Date   Coronary artery disease, non-occlusive    a. 2009 Cath: reportedly nonobstructive; b. 07/2015 treadmill stress test without evidence of ischemia, exercised 2.5 minutes   Hyperlipidemia    Hypertension    a. 2016 renal artery ultrasound negative for RAS   Kidney stones     Past Surgical History:  Procedure Laterality Date   CARDIAC CATHETERIZATION     [facility]   MULTIPLE TOOTH EXTRACTIONS       Current Outpatient Medications  Medication Sig Dispense Refill   chlorthalidone  (HYGROTON ) 25 MG tablet  Take 1 tablet (25 mg total) by mouth daily. 90 tablet 3   lisinopril  (ZESTRIL ) 40 MG tablet Take 1 tablet (40 mg total) by mouth daily. 90 tablet 3   loratadine (CLARITIN) 10 MG tablet Take 10 mg by mouth daily.     metoprolol  tartrate (LOPRESSOR ) 25 MG tablet Take 1 tablet (25 mg total) by mouth 2 (two) times daily. 180 tablet 3   simvastatin  (ZOCOR ) 20 MG tablet Take 1 tablet (20 mg total) by mouth daily. 90 tablet 3   aspirin  81 MG chewable tablet Chew 1 tablet (81 mg total) by mouth daily. (Patient not taking: Reported on 08/08/2024) 30 tablet 0   No current facility-administered medications for this visit.    Allergies:   Penicillins    Social History:  The patient  reports that she has been smoking cigarettes. She has never used smokeless tobacco. She reports that she does not drink alcohol and does not use drugs.   Family History:  The patient's Family history is unknown by patient.    ROS:  Please see the history of present illness.   Otherwise, review of systems are positive for none.   All other systems are reviewed and negative.    PHYSICAL EXAM: VS:  BP 110/60 (BP Location: Left Arm, Patient Position: Sitting, Cuff Size: Normal)   Pulse 65   Ht 5' 5 (1.651 m)   Wt 126 lb 2 oz (57.2 kg)   SpO2 97%  BMI 20.99 kg/m  , BMI Body mass index is 20.99 kg/m. GEN: Well nourished, well developed, in no acute distress  HEENT: normal  Neck: no JVD,  or masses.  Left carotid bruit Cardiac: RRR; no murmurs, rubs, or gallops,no edema  Respiratory:  clear to auscultation bilaterally, normal work of breathing GI: soft, nontender, nondistended, + BS MS: no deformity or atrophy  Skin: warm and dry, no rash Neuro:  Strength and sensation are intact Psych: euthymic mood, full affect Vascular: Distal pulses are not palpable  EKG:  EKG is ordered today. The ekg ordered today demonstrates: Normal sinus rhythm Normal ECG When compared with ECG of 18-Oct-2023 13:33, No significant  change was found    Recent Labs: 10/18/2023: ALT 13; Hemoglobin 13.0; Platelets 355 10/29/2023: BUN 33; Creatinine, Ser 1.45; Potassium 3.5; Sodium 139    Lipid Panel    Component Value Date/Time   CHOL 151 10/18/2023 1407   TRIG 129 10/18/2023 1407   HDL 45 10/18/2023 1407   CHOLHDL 3.4 10/18/2023 1407   CHOLHDL 3.3 04/06/2022 1436   VLDL 31 04/06/2022 1436   LDLCALC 83 10/18/2023 1407   LDLDIRECT 86 10/18/2023 1407      Wt Readings from Last 3 Encounters:  08/08/24 126 lb 2 oz (57.2 kg)  10/18/23 133 lb 12.8 oz (60.7 kg)  04/06/22 130 lb 12.8 oz (59.3 kg)            No data to display            ASSESSMENT AND PLAN:  1.  Peripheral arterial disease with moderately reduced ABI: She seems to be completely asymptomatic with no claudication or critical limb ischemia.  Thus, no indication for revascularization.  I discussed the importance of controlling her risk factors. I asked her to resume aspirin  81 mg once daily.  2.  Hyperlipidemia: LDL was above 70 on simvastatin .  We need to be more aggressive and get her LDL below 55.  I elected to switch simvastatin  to rosuvastatin  40 mg once daily and will recheck labs in 2 months.  3.  Left carotid bruit: I requested carotid Doppler.  4. Essential hypertension: Blood pressure is well controlled on current medications.    5.  Tobacco use: she cut down on tobacco use a few cigarettes a day.  I discussed with her the importance of smoking cessation.    Disposition:   FU in 6 months.  Signed,  Deatrice Cage, MD  08/08/2024 10:37 AM    [facility]

## 2024-08-08 NOTE — Patient Instructions (Signed)
 Medication Instructions:  START Aspirin  81 mg once daily START Rosuvastatin  (Crestor ) 40 mg once daily  STOP the Simvastatin  *If you need a refill on your cardiac medications before your next appointment, please call your pharmacy*  Lab Work: Your provider would like for you to return in 2 months to have the following labs drawn: CMET and Fasting Lipid.   Please go to [facility] [address], [place] [address] You do not need an appointment.  They are open from 8 am- 4:30 pm.  Lunch from 1:00 pm- 2:00 pm You will need to be fasting.   You may also go to one of the following [facility]:  [address] [place], [place] [address] Phone: [phone] Lab hours: Mon-Fri 8 am- 5 pm    Lunch 12 pm- 1 pm  [address] [place],  [place]  [address]  [place]  Phone: [phone] Lab hours: 7 am- 4 pm Lunch 12 pm-1 pm   [address] [place],  [place]  [address]  [place]  Phone: [phone] Lab hours: Mon-Fri 8 am- 5 pm    Lunch 12 pm- 1 pm  If you have labs (blood work) drawn today and your tests are completely normal, you will receive your results only by: MyChart Message (if you have MyChart) OR A paper copy in the mail If you have any lab test that is abnormal or we need to change your treatment, we will call you to review the results.  Testing/Procedures: Your physician has requested that you have a carotid duplex. This test is an ultrasound of the carotid arteries in your neck. It looks at blood flow through these arteries that supply the brain with blood.   Allow one hour for this exam.  There are no restrictions or special instructions.  This will take place at [address] [facility]) [address], [place] [address]  Please note: We ask at that you not bring children with you during ultrasound (echo/ vascular) testing. Due to room size and safety concerns, children are not allowed in the ultrasound rooms during exams. Our front  office staff cannot provide observation of children in our lobby area while testing is being conducted. An adult accompanying a patient to their appointment will only be allowed in the ultrasound room at the discretion of the ultrasound technician under special circumstances. We apologize for any inconvenience.   Follow-Up: At [facility], you and your health needs are our priority.  As part of our continuing mission to provide you with exceptional heart care, our providers are all part of one team.  This team includes your primary Cardiologist (physician) and Advanced Practice Providers or APPs (Physician Assistants and Nurse Practitioners) who all work together to provide you with the care you need, when you need it.  Your next appointment:   6 month(s)  Provider:   You may see Deatrice Cage, MD or one of the following Advanced Practice Providers on your designated Care Team:   Lonni Meager, NP Lesley Maffucci, PA-C Bernardino Bring, PA-C Cadence Bailey, PA-C Tylene Lunch, NP Barnie Hila, NP    We recommend signing up for the patient portal called MyChart.  Sign up information is provided on this After Visit Summary.  MyChart is used to connect with patients for Virtual Visits (Telemedicine).  Patients are able to view lab/test results, encounter notes, upcoming appointments, etc.  Non-urgent messages can be sent to your provider as well.   To learn more about what  you can do with MyChart, go to [email or web address].   Other Instructions Managing the Challenge of Quitting Smoking Quitting smoking is a physical and mental challenge. You may have cravings, withdrawal symptoms, and temptation to smoke. Before quitting, work with your health care provider to make a plan that can help you manage quitting. Making a plan before you quit may keep you from smoking when you have the urge to smoke while trying to quit. How to manage lifestyle changes Managing stress Stress can make  you want to smoke, and wanting to smoke may cause stress. It is important to find ways to manage your stress. You could try some of the following: Practice relaxation techniques. Breathe slowly and deeply, in through your nose and out through your mouth. Listen to music. Soak in a bath or take a shower. Imagine a peaceful place or vacation. Get some support. Talk with family or friends about your stress. Join a support group. Talk with a counselor or therapist. Get some physical activity. Go for a walk, run, or bike ride. Play a favorite sport. Practice yoga.  Medicines Talk with your health care provider about medicines that might help you deal with cravings and make quitting easier for you. Relationships Social situations can be difficult when you are quitting smoking. To manage this, you can: Avoid parties and other social situations where people might be smoking. Avoid alcohol. Leave right away if you have the urge to smoke. Explain to your family and friends that you are quitting smoking. Ask for support and let them know you might be a bit grumpy. Plan activities where smoking is not an option. General instructions Be aware that many people gain weight after they quit smoking. However, not everyone does. To keep from gaining weight, have a plan in place before you quit, and stick to the plan after you quit. Your plan should include: Eating healthy snacks. When you have a craving, it may help to: Eat popcorn, or try carrots, celery, or other cut vegetables. Chew sugar-free gum. Changing how you eat. Eat small portion sizes at meals. Eat 4-6 small meals throughout the day instead of 1-2 large meals a day. Be mindful when you eat. You should avoid watching television or doing other things that might distract you as you eat. Exercising regularly. Make time to exercise each day. If you do not have time for a long workout, do short bouts of exercise for 5-10 minutes several times a  day. Do some form of strengthening exercise, such as weight lifting. Do some exercise that gets your heart beating and causes you to breathe deeply, such as walking fast, running, swimming, or biking. This is very important. Drinking plenty of water or other low-calorie or no-calorie drinks. Drink enough fluid to keep your urine pale yellow.  How to recognize withdrawal symptoms Your body and mind may experience discomfort as you try to get used to not having nicotine in your system. These effects are called withdrawal symptoms. They may include: Feeling hungrier than normal. Having trouble concentrating. Feeling irritable or restless. Having trouble sleeping. Feeling depressed. Craving a cigarette. These symptoms may surprise you, but they are normal to have when quitting smoking. To manage withdrawal symptoms: Avoid places, people, and activities that trigger your cravings. Remember why you want to quit. Get plenty of sleep. Avoid coffee and other drinks that contain caffeine. These may worsen some of your symptoms. How to manage cravings Come up with a plan for how to deal with your  cravings. The plan should include the following: A definition of the specific situation you want to deal with. An activity or action you will take to replace smoking. A clear idea for how this action will help. The name of someone who could help you with this. Cravings usually last for 5-10 minutes. Consider taking the following actions to help you with your plan to deal with cravings: Keep your mouth busy. Chew sugar-free gum. Suck on hard candies or a straw. Brush your teeth. Keep your hands and body busy. Change to a different activity right away. Squeeze or play with a ball. Do an activity or a hobby, such as making bead jewelry, practicing needlepoint, or working with wood. Mix up your normal routine. Take a short exercise break. Go for a quick walk, or run up and down stairs. Focus on doing  something kind or helpful for someone else. Call a friend or family member to talk during a craving. Join a support group. Contact a quitline. Where to find support To get help or find a support group: Call the National Cancer Institute's Smoking Quitline: 1-800-QUIT-NOW (612)230-2716) Text QUIT to SmokefreeTXT: 521151 Where to find more information Visit these websites to find more information on quitting smoking: U.S. Department of Health and Human Services: www.smokefree.gov American Lung Association: www.freedomfromsmoking.org Centers for Disease Control and Prevention (CDC): footballexhibition.com.br American Heart Association: www.heart.org Contact a health care provider if: You want to change your plan for quitting. The medicines you are taking are not helping. Your eating feels out of control or you cannot sleep. You feel depressed or become very anxious. Summary Quitting smoking is a physical and mental challenge. You will face cravings, withdrawal symptoms, and temptation to smoke again. Preparation can help you as you go through these challenges. Try different techniques to manage stress, handle social situations, and prevent weight gain. You can deal with cravings by keeping your mouth busy (such as by chewing gum), keeping your hands and body busy, calling family or friends, or contacting a quitline for people who want to quit smoking. You can deal with withdrawal symptoms by avoiding places where people smoke, getting plenty of rest, and avoiding drinks that contain caffeine. This information is not intended to replace advice given to you by your health care provider. Make sure you discuss any questions you have with your health care provider. Document Revised: 08/12/2021 Document Reviewed: 08/12/2021 Elsevier Patient Education  2024 [employer].

## 2024-09-12 ENCOUNTER — Ambulatory Visit: Attending: Cardiovascular Disease

## 2024-09-12 DIAGNOSIS — R0989 Other specified symptoms and signs involving the circulatory and respiratory systems: Secondary | ICD-10-CM

## 2024-09-18 ENCOUNTER — Ambulatory Visit: Payer: Self-pay | Admitting: Cardiovascular Disease
